# Patient Record
Sex: Female | Born: 1945
Health system: Southern US, Community
[De-identification: ages and names within clinical notes are randomized; demographics above are authoritative.]

## PROBLEM LIST (undated history)

## (undated) DIAGNOSIS — R112 Nausea with vomiting, unspecified: Secondary | ICD-10-CM

## (undated) DIAGNOSIS — Z87442 Personal history of urinary calculi: Secondary | ICD-10-CM

## (undated) DIAGNOSIS — M1990 Unspecified osteoarthritis, unspecified site: Secondary | ICD-10-CM

## (undated) DIAGNOSIS — Z9889 Other specified postprocedural states: Secondary | ICD-10-CM

## (undated) DIAGNOSIS — Z973 Presence of spectacles and contact lenses: Secondary | ICD-10-CM

## (undated) DIAGNOSIS — E7800 Pure hypercholesterolemia, unspecified: Secondary | ICD-10-CM

## (undated) HISTORY — PX: BRAIN SURGERY: SHX531

## (undated) HISTORY — PX: ABDOMINAL HYSTERECTOMY: SHX81

---

## 2000-02-11 ENCOUNTER — Other Ambulatory Visit: Admission: RE | Admit: 2000-02-11 | Discharge: 2000-02-11 | Payer: Self-pay | Admitting: Obstetrics and Gynecology

## 2000-10-01 ENCOUNTER — Ambulatory Visit (HOSPITAL_COMMUNITY)
Admission: RE | Admit: 2000-10-01 | Discharge: 2000-10-01 | Payer: Self-pay | Admitting: Physical Medicine & Rehabilitation

## 2000-10-01 ENCOUNTER — Encounter: Payer: Self-pay | Admitting: Physical Medicine & Rehabilitation

## 2001-02-22 ENCOUNTER — Other Ambulatory Visit: Admission: RE | Admit: 2001-02-22 | Discharge: 2001-02-22 | Payer: Self-pay | Admitting: Obstetrics and Gynecology

## 2007-04-17 ENCOUNTER — Emergency Department (HOSPITAL_COMMUNITY): Admission: EM | Admit: 2007-04-17 | Discharge: 2007-04-17 | Payer: Self-pay | Admitting: Emergency Medicine

## 2007-08-10 ENCOUNTER — Ambulatory Visit: Payer: Self-pay | Admitting: Cardiology

## 2007-12-03 ENCOUNTER — Ambulatory Visit (HOSPITAL_COMMUNITY)
Admission: RE | Admit: 2007-12-03 | Discharge: 2007-12-03 | Payer: Self-pay | Admitting: Physical Medicine & Rehabilitation

## 2008-01-18 ENCOUNTER — Emergency Department (HOSPITAL_COMMUNITY): Admission: EM | Admit: 2008-01-18 | Discharge: 2008-01-18 | Payer: Self-pay | Admitting: Family Medicine

## 2009-04-13 ENCOUNTER — Encounter: Payer: Self-pay | Admitting: Cardiology

## 2009-05-26 DIAGNOSIS — R634 Abnormal weight loss: Secondary | ICD-10-CM

## 2009-05-26 DIAGNOSIS — Z87891 Personal history of nicotine dependence: Secondary | ICD-10-CM | POA: Insufficient documentation

## 2009-05-31 ENCOUNTER — Ambulatory Visit: Payer: Self-pay | Admitting: Cardiology

## 2009-05-31 DIAGNOSIS — E782 Mixed hyperlipidemia: Secondary | ICD-10-CM

## 2009-07-27 ENCOUNTER — Ambulatory Visit: Payer: Self-pay | Admitting: Cardiology

## 2009-08-01 ENCOUNTER — Telehealth: Payer: Self-pay | Admitting: Cardiology

## 2009-08-01 LAB — CONVERTED CEMR LAB
ALT: 49 units/L — ABNORMAL HIGH (ref 0–35)
AST: 31 units/L (ref 0–37)
Albumin: 3.9 g/dL (ref 3.5–5.2)
Alkaline Phosphatase: 94 units/L (ref 39–117)
Bilirubin, Direct: 0 mg/dL (ref 0.0–0.3)
Cholesterol: 218 mg/dL — ABNORMAL HIGH (ref 0–200)
Direct LDL: 148.6 mg/dL
HDL: 56.6 mg/dL (ref >39.00)
Total Bilirubin: 0.7 mg/dL (ref 0.3–1.2)
Total CHOL/HDL Ratio: 4
Total Protein: 7.4 g/dL (ref 6.0–8.3)
Triglycerides: 126 mg/dL (ref 0.0–149.0)
VLDL: 25.2 mg/dL (ref 0.0–40.0)

## 2009-08-06 ENCOUNTER — Telehealth: Payer: Self-pay | Admitting: Cardiology

## 2009-09-13 ENCOUNTER — Ambulatory Visit: Payer: Self-pay | Admitting: Cardiology

## 2009-09-18 LAB — CONVERTED CEMR LAB
ALT: 31 units/L (ref 0–35)
AST: 26 units/L (ref 0–37)
Albumin: 3.9 g/dL (ref 3.5–5.2)
Alkaline Phosphatase: 73 units/L (ref 39–117)
Bilirubin, Direct: 0 mg/dL (ref 0.0–0.3)
Cholesterol: 149 mg/dL (ref 0–200)
HDL: 53.4 mg/dL (ref >39.00)
LDL Cholesterol: 73 mg/dL (ref 0–99)
Total Bilirubin: 0.8 mg/dL (ref 0.3–1.2)
Total CHOL/HDL Ratio: 3
Total Protein: 7 g/dL (ref 6.0–8.3)
Triglycerides: 111 mg/dL (ref 0.0–149.0)
VLDL: 22.2 mg/dL (ref 0.0–40.0)

## 2010-11-26 ENCOUNTER — Ambulatory Visit: Payer: BC Managed Care – PPO | Admitting: Physical Medicine & Rehabilitation

## 2010-11-26 ENCOUNTER — Encounter: Payer: BC Managed Care – PPO | Attending: Physical Medicine & Rehabilitation

## 2010-11-26 DIAGNOSIS — M719 Bursopathy, unspecified: Secondary | ICD-10-CM | POA: Insufficient documentation

## 2010-11-26 DIAGNOSIS — M1710 Unilateral primary osteoarthritis, unspecified knee: Secondary | ICD-10-CM

## 2010-11-26 DIAGNOSIS — M25569 Pain in unspecified knee: Secondary | ICD-10-CM | POA: Insufficient documentation

## 2010-11-26 DIAGNOSIS — M25519 Pain in unspecified shoulder: Secondary | ICD-10-CM | POA: Insufficient documentation

## 2010-11-26 DIAGNOSIS — M47812 Spondylosis without myelopathy or radiculopathy, cervical region: Secondary | ICD-10-CM

## 2010-11-26 DIAGNOSIS — G8929 Other chronic pain: Secondary | ICD-10-CM | POA: Insufficient documentation

## 2010-11-26 DIAGNOSIS — M542 Cervicalgia: Secondary | ICD-10-CM | POA: Insufficient documentation

## 2010-11-26 DIAGNOSIS — M67919 Unspecified disorder of synovium and tendon, unspecified shoulder: Secondary | ICD-10-CM | POA: Insufficient documentation

## 2010-11-26 DIAGNOSIS — M7530 Calcific tendinitis of unspecified shoulder: Secondary | ICD-10-CM

## 2011-02-04 NOTE — Assessment & Plan Note (Signed)
[facility]                            CARDIOLOGY OFFICE NOTE   NAME:Rachel Barker                     MRN:          [id number]  DATE:08/10/2007                            DOB:          [birth year]    CHIEF COMPLAINT:  My cholesterol is high, and I am getting fat.   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 Thressa Sheller.   Her cardiac risk factors include age, hyperlipidemia with a total  cholesterol of 255, HDL 48, LDL 179, triglycerides 139.  She has a  remote history of smoking but was never heavy.   She is not able to exercise because of long hours and just not having  the desire now that her husband has died.  He died of non-small-cell  lung cancer about a year and one-half ago.  She lives alone.   She said that when she weighed 20 pounds less, her HDL was 60.   She is having no symptoms of angina or ischemia.   PAST MEDICAL HISTORY:  She is not allergic to dye.  She is intolerant to  Athalia, VICODIN, and PERCOCET.   She drinks a couple of cups of coffee a day, but she is cutting that out  and going just to decaf Cokes.   She does a lot of yard work,  walks occasionally but not regularly.   PAST SURGICAL HISTORY:  1. Tonsillectomy.  2. Hysterectomy.  3. Oophorectomy.  4. Breast implants.   CURRENT MEDICATIONS:  1. Premarin cream 2 times a week.  2. Cymbalta 30 mg a day.   FAMILY HISTORY:  Negative for premature coronary disease.   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  She is an  Clinical research associate, by the way.  She is a widow and has one child.  The  child lives not too far from her.   REVIEW OF SYSTEMS:  Negative otherwise.   PHYSICAL EXAMINATION:  VITAL SIGNS:  Blood pressure 119/82, pulse 89 and  regular. She is 5 feet 3-1/2 inches.  She weight 148  pounds.  HEENT:  Normocephalic and atraumatic.  PERRLA.  Extraocular movements  intact.  Sclerae clear.  Facial symmetry is normal.  NECK:  Supple.  Carotid upstrokes are equal bilaterally without bruits.  No JVD.  Thyroid is not enlarged. Trachea is midline.  LUNGS:  Clear.  HEART:  Reveals a nondisplaced PMI.  She has normal S1 and S2.  ABDOMEN:  Soft.  Good bowel sounds.  There is no midline bruit.  There  is no hepatomegaly.  EXTREMITIES:  Reveal no cyanosis, clubbing, or edema.  Pulses are  intact.  NEUROLOGIC:  Exam is intact.  SKIN:  Unremarkable.   EKG is completely normal.   I have had about a 30-minute discussion with Rachel Barker.  We talked about  therapeutic lifestyle changes including trying to lose about 15 pounds  realistically in the next 6 months, to walk 3  hours per week and then  have repeat blood work including a lipid panel as well as CRP per the  JUPITER study.  She seems to buy in and have ownership into improving  her lifestyle.   PLAN:  1. Fifteen pounds of weight loss over the next 6 months. I will see      her back in May 2009.  2. Three hours of walking per week.  3. Repeat lipids and LFTs in May.  At that time, hopefully her HDL      will be in the mid 50s or around 60.  If her HDL is increased that      much, she will have a very favorable total cholesterol-HDL ratio      and probably will not warrant a statin.  We will also check a C-      reactive protein.     Thomas C. Daleen Squibb, MD, [facility]  Electronically Signed    TCW/MedQ  DD: 08/10/2007  DT: 08/11/2007  Job #: [id number]   cc:   Guy Sandifer. Henderson Cloud, M.D.

## 2013-04-07 ENCOUNTER — Other Ambulatory Visit: Payer: Self-pay | Admitting: Obstetrics and Gynecology

## 2013-04-07 DIAGNOSIS — R928 Other abnormal and inconclusive findings on diagnostic imaging of breast: Secondary | ICD-10-CM

## 2013-04-19 ENCOUNTER — Ambulatory Visit
Admission: RE | Admit: 2013-04-19 | Discharge: 2013-04-19 | Disposition: A | Discharge status: Home / Self Care | Payer: Medicare Other | Source: Ambulatory Visit | Attending: Obstetrics and Gynecology | Admitting: Obstetrics and Gynecology

## 2013-04-19 DIAGNOSIS — R928 Other abnormal and inconclusive findings on diagnostic imaging of breast: Secondary | ICD-10-CM

## 2015-10-12 DIAGNOSIS — H3562 Retinal hemorrhage, left eye: Secondary | ICD-10-CM | POA: Diagnosis not present

## 2015-10-12 DIAGNOSIS — H532 Diplopia: Secondary | ICD-10-CM | POA: Diagnosis not present

## 2015-10-12 DIAGNOSIS — H2513 Age-related nuclear cataract, bilateral: Secondary | ICD-10-CM | POA: Diagnosis not present

## 2015-10-19 ENCOUNTER — Emergency Department (HOSPITAL_COMMUNITY): Payer: Medicare Other

## 2015-10-19 ENCOUNTER — Encounter (HOSPITAL_COMMUNITY): Payer: Self-pay | Admitting: Cardiology

## 2015-10-19 ENCOUNTER — Emergency Department (HOSPITAL_COMMUNITY)
Admission: EM | Admit: 2015-10-19 | Discharge: 2015-10-19 | Disposition: A | Discharge status: Home / Self Care | Payer: Medicare Other | Attending: Emergency Medicine | Admitting: Emergency Medicine

## 2015-10-19 DIAGNOSIS — Z7982 Long term (current) use of aspirin: Secondary | ICD-10-CM | POA: Diagnosis not present

## 2015-10-19 DIAGNOSIS — H538 Other visual disturbances: Secondary | ICD-10-CM | POA: Diagnosis present

## 2015-10-19 DIAGNOSIS — Z8639 Personal history of other endocrine, nutritional and metabolic disease: Secondary | ICD-10-CM | POA: Diagnosis not present

## 2015-10-19 DIAGNOSIS — H4922 Sixth [abducent] nerve palsy, left eye: Secondary | ICD-10-CM | POA: Insufficient documentation

## 2015-10-19 DIAGNOSIS — G529 Cranial nerve disorder, unspecified: Secondary | ICD-10-CM | POA: Diagnosis not present

## 2015-10-19 DIAGNOSIS — Z87891 Personal history of nicotine dependence: Secondary | ICD-10-CM | POA: Insufficient documentation

## 2015-10-19 DIAGNOSIS — H532 Diplopia: Secondary | ICD-10-CM

## 2015-10-19 DIAGNOSIS — R51 Headache: Secondary | ICD-10-CM | POA: Diagnosis not present

## 2015-10-19 HISTORY — DX: Pure hypercholesterolemia, unspecified: E78.00

## 2015-10-19 LAB — COMPREHENSIVE METABOLIC PANEL
ALBUMIN: 4 g/dL (ref 3.5–5.0)
ALK PHOS: 88 U/L (ref 38–126)
ALT: 23 U/L (ref 14–54)
AST: 21 U/L (ref 15–41)
Anion gap: 10 (ref 5–15)
BILIRUBIN TOTAL: 0.5 mg/dL (ref 0.3–1.2)
BUN: 13 mg/dL (ref 6–20)
CO2: 26 mmol/L (ref 22–32)
CREATININE: 0.74 mg/dL (ref 0.44–1.00)
Calcium: 9.4 mg/dL (ref 8.9–10.3)
Chloride: 105 mmol/L (ref 101–111)
GFR calc Af Amer: >60 mL/min (ref >60)
GLUCOSE: 97 mg/dL (ref 65–99)
POTASSIUM: 3.8 mmol/L (ref 3.5–5.1)
Sodium: 141 mmol/L (ref 135–145)
TOTAL PROTEIN: 6.7 g/dL (ref 6.5–8.1)

## 2015-10-19 LAB — CBC WITH DIFFERENTIAL/PLATELET
BASOS PCT: 0 %
Basophils Absolute: 0 10*3/uL (ref 0.0–0.1)
Eosinophils Absolute: 0 10*3/uL (ref 0.0–0.7)
Eosinophils Relative: 0 %
HEMATOCRIT: 43.1 % (ref 36.0–46.0)
Hemoglobin: 14.5 g/dL (ref 12.0–15.0)
LYMPHS PCT: 23 %
Lymphs Abs: 1.8 10*3/uL (ref 0.7–4.0)
MCH: 30.9 pg (ref 26.0–34.0)
MCHC: 33.6 g/dL (ref 30.0–36.0)
MCV: 91.7 fL (ref 78.0–100.0)
MONO ABS: 0.5 10*3/uL (ref 0.1–1.0)
MONOS PCT: 6 %
NEUTROS ABS: 5.5 10*3/uL (ref 1.7–7.7)
Neutrophils Relative %: 71 %
Platelets: 328 10*3/uL (ref 150–400)
RBC: 4.7 MIL/uL (ref 3.87–5.11)
RDW: 12.6 % (ref 11.5–15.5)
WBC: 7.8 10*3/uL (ref 4.0–10.5)

## 2015-10-19 LAB — BASIC METABOLIC PANEL
ANION GAP: 13 (ref 5–15)
BUN: 14 mg/dL (ref 6–20)
CALCIUM: 9.7 mg/dL (ref 8.9–10.3)
CHLORIDE: 106 mmol/L (ref 101–111)
CO2: 24 mmol/L (ref 22–32)
Creatinine, Ser: 0.69 mg/dL (ref 0.44–1.00)
GFR calc non Af Amer: >60 mL/min (ref >60)
GLUCOSE: 97 mg/dL (ref 65–99)
POTASSIUM: 3.9 mmol/L (ref 3.5–5.1)
Sodium: 143 mmol/L (ref 135–145)

## 2015-10-19 LAB — TSH: TSH: 2.403 u[IU]/mL (ref 0.350–4.500)

## 2015-10-19 MED ORDER — GADOBENATE DIMEGLUMINE 529 MG/ML IV SOLN
14.0000 mL | Freq: Once | INTRAVENOUS | Status: AC | PRN
  Administered 2015-10-19: 14 mL via INTRAVENOUS

## 2015-10-19 NOTE — Discharge Instructions (Signed)
You'll need an MRI of your pituitary to evaluate for the possibility of a left cavernous sinus meningioma  Please return to the emergency department for any new or worsening symptoms

## 2015-10-19 NOTE — ED Notes (Signed)
Reports she has been having double vision in the left eye since Thursday. Went to the eye doctor on Friday and had labs done with normal exam. States she was told she may need an MRI. Also reports a mild headache and eye pain towards the end of the day.

## 2015-10-19 NOTE — ED Provider Notes (Signed)
Patient noted to have a possible left-sided cavernous sinus meningioma.  This would likely explain her symptoms.  It is inconclusive however on MRI today and she will need MRI of her pituitary which can be completed as an outpatient.  This is unable to be completed today secondary to the use of contrast with the prior MRI.  I spoke with neurosurgery who recommends outpatient follow-up.  I have updated the patient and she understands that she'll need additional imaging.  She is without a primary care doctor at this time and therefore she will follow-up with the neurosurgical team here in town.  Jola Schmidt, MD 10/19/15 2108

## 2015-10-19 NOTE — ED Notes (Signed)
Patient verbalized understanding of discharge instructions and denies any further needs or questions at this time. VS stable. Patient ambulatory with steady gait.  

## 2015-10-19 NOTE — ED Provider Notes (Signed)
CSN: [id number]     Arrival date & time 10/19/15  1111 History   First MD Initiated Contact with Patient 10/19/15 1309     Chief Complaint  Patient presents with  . Eye Pain  . Blurred Vision     (Consider location/radiation/quality/duration/timing/severity/associated sxs/prior Treatment) HPI   Pt is a 70 yo female with PMH of hypercholesteremia who presents to the ED with blurred vision, onset 1 week. Pt reports having constant diplopia when she has both eyes open. She notes she saw her ophthalmologist (Dr. Katy Fitch) last Friday, negative blood work and ESR and was advised that she may need an MRI for further evaluation. She notes she had a "tension" headache last night located to her frontal and occipital region, denies any aggravating or alleviating factors. She notes she took one dose of tylenol before going to bed and notes when she woke up this morning and her headache was resolved. However she reports waking up this morning and having ptosis of her left eye which resolved over 1-2 hours. Denies fever, chills, neck pain/stiffness, lightheadedness, dizziness, cough, SOB, CP, palpitations, abdominal pain, N/V/D, numbness, tingling, weakness.  Past Medical History  Diagnosis Date  . Hypercholesteremia    Past Surgical History  Procedure Laterality Date  . Abdominal hysterectomy    . Brain surgery     Family History  Problem Relation Age of Onset  . Hyperlipidemia Mother   . Hyperlipidemia Father    Social History  Substance Use Topics  . Smoking status: Former Research scientist (life sciences)  . Smokeless tobacco: None  . Alcohol Use: Yes   OB History    No data available     Review of Systems  Eyes: Positive for visual disturbance. Photophobia: double vision.       Left eye ptosis  Neurological: Positive for headaches.      Allergies  Fentanyl; Hydrocodone-acetaminophen; and Oxycodone-acetaminophen  Home Medications   Prior to Admission medications   Medication Sig Start Date End Date  Taking? Authorizing Provider  aspirin 81 MG tablet Take 81 mg by mouth daily.   Yes Historical Provider, MD   BP 125/71 mmHg  Pulse 66  Temp(Src) 98.5 F (36.9 C) (Oral)  Resp 19  Ht 5' 3.5" (1.613 m)  Wt 66.679 kg  BMI 25.63 kg/m2  SpO2 94% Physical Exam  Constitutional: She is oriented to person, place, and time. She appears well-developed and well-nourished.  HENT:  Head: Normocephalic and atraumatic.  Mouth/Throat: Oropharynx is clear and moist. No oropharyngeal exudate.  Eyes: Conjunctivae and EOM are normal. Pupils are equal, round, and reactive to light. Right eye exhibits no discharge. Left eye exhibits no discharge. No scleral icterus.  Decreased ROM of left eye with lateral gaze, otherwise bilateral eyes EOMI  Neck: Normal range of motion. Neck supple.  Cardiovascular: Normal rate, regular rhythm, normal heart sounds and intact distal pulses.   Pulmonary/Chest: Effort normal and breath sounds normal. No respiratory distress. She has no wheezes. She has no rales. She exhibits no tenderness.  Abdominal: Soft. Bowel sounds are normal. She exhibits no distension and no mass. There is no tenderness. There is no rebound and no guarding.  Musculoskeletal: Normal range of motion. She exhibits no edema or tenderness.  Lymphadenopathy:    She has no cervical adenopathy.  Neurological: She is alert and oriented to person, place, and time. She has normal strength. No cranial nerve deficit or sensory deficit. She displays a negative Romberg sign. Coordination normal.  Skin: Skin is warm and dry.  Nursing note and vitals reviewed.   ED Course  Procedures (including critical care time) Labs Review Labs Reviewed  CBC WITH DIFFERENTIAL/PLATELET  BASIC METABOLIC PANEL  COMPREHENSIVE METABOLIC PANEL  HEMOGLOBIN A1C  TSH    Imaging Review No results found. I have personally reviewed and evaluated these images and lab results as part of my medical decision-making.   EKG  Interpretation None      MDM   Final diagnoses:  Sixth nerve palsy, left  Sixth nerve palsy, left    Pt presents with diplopia an transient left ptosis. She was initially evaluated by her ophthalmologist 1 week ago when sxs started Pt reports her exam and labs negative, and Dr. Katy Fitch recommended MRI for further evaluation. VSS. Exam revealed left CN-6 palsy.   Consulted neurology. Dr. Silverio Decamp reports he will come evaluate the pt in the ED. Neuro evaluated pt in the ED and recommended to order MRI brain w/ and w/o contrast to r/o posterior fossa or brainstem pathology and MRA head to r/o intracranial aneurysm. Recommended if MRIs are negative to have pt f/u with ophthalmologist outpatient in 1-2 weeks.   Hand-off to Dr. Venora Maples. MRIs pending.   Chesley Noon [place], [place] 10/19/15 1944  Merrily Pew, MD 10/20/15 [phone]

## 2015-10-19 NOTE — Consult Note (Signed)
Requesting Physician: Dr. Dayna Barker    Chief Complaint:  Left 6th nerve palsy  HPI:                                                                                                                                         Rachel Barker is an 70 y.o. female who noted about one week ago she had transient diplopia at far distances. This came and went but due to no pain she did not seek attention. Due to the diplopia not going away and some eye pain while reading she saw a ophthalmologist.  Her eye exam was normal other than a 6th nerve palsy. She was instructed to go to ED.  She denies any neck pain or trauma. She did have transient ptosis of left eye this am but this has resolved. No pain other than when she looks to the right.  This causes pain in the left eye. She has been on ASA for 7 days.   Date last known well: one weeks ago Time last known well: Unable to determine tPA Given: No: out of window     Past Medical History  Diagnosis Date  . Hypercholesteremia     Past Surgical History  Procedure Laterality Date  . Abdominal hysterectomy    . Brain surgery      Family History  Problem Relation Age of Onset  . Hyperlipidemia Mother   . Hyperlipidemia Father    Social History:  reports that she has quit smoking. She does not have any smokeless tobacco history on file. She reports that she drinks alcohol. She reports that she does not use illicit drugs.  Allergies:  Allergies  Allergen Reactions  . Fentanyl Swelling  . Hydrocodone-Acetaminophen Swelling  . Oxycodone-Acetaminophen Itching and Swelling    Medications:                                                                                                                           No current facility-administered medications for this encounter.   Current Outpatient Prescriptions  Medication Sig Dispense Refill  . aspirin 81 MG tablet Take 81 mg by mouth daily.       ROS:  History obtained from the patient  General ROS: negative for - chills, fatigue, fever, night sweats, weight gain or weight loss Psychological ROS: negative for - behavioral disorder, hallucinations, memory difficulties, mood swings or suicidal ideation Ophthalmic ROS: negative for - blurry vision, double vision, eye pain or loss of vision ENT ROS: negative for - epistaxis, nasal discharge, oral lesions, sore throat, tinnitus or vertigo Allergy and Immunology ROS: negative for - hives or itchy/watery eyes Hematological and Lymphatic ROS: negative for - bleeding problems, bruising or swollen lymph nodes Endocrine ROS: negative for - galactorrhea, hair pattern changes, polydipsia/polyuria or temperature intolerance Respiratory ROS: negative for - cough, hemoptysis, shortness of breath or wheezing Cardiovascular ROS: negative for - chest pain, dyspnea on exertion, edema or irregular heartbeat Gastrointestinal ROS: negative for - abdominal pain, diarrhea, hematemesis, nausea/vomiting or stool incontinence Genito-Urinary ROS: negative for - dysuria, hematuria, incontinence or urinary frequency/urgency Musculoskeletal ROS: negative for - joint swelling or muscular weakness Neurological ROS: as noted in HPI Dermatological ROS: negative for rash and skin lesion changes  Neurologic Examination:                                                                                                      Blood pressure 134/82, pulse 66, temperature 98.5 F (36.9 C), temperature source Oral, resp. rate 15, height 5' 3.5" (1.613 m), weight 66.679 kg (147 lb), SpO2 98 %.  HEENT-  Normocephalic, no lesions, without obvious abnormality.  Normal external eye and conjunctiva.  Normal TM's bilaterally.  Normal auditory canals and external ears. Normal external nose, mucus membranes and septum.  Normal pharynx. Cardiovascular- S1, S2  normal, pulses palpable throughout   Lungs- chest clear, no wheezing, rales, normal symmetric air entry Abdomen- normal findings: bowel sounds normal Extremities- no edema Lymph-no adenopathy palpable Musculoskeletal-no joint tenderness, deformity or swelling Skin-warm and dry, no hyperpigmentation, vitiligo, or suspicious lesions  Neurological Examination Mental Status: Alert, oriented, thought content appropriate.  Speech fluent without evidence of aphasia.  Able to follow 3 step commands without difficulty. Cranial Nerves: II: Discs flat bilaterally; Visual fields grossly normal, pupils equal, round, reactive to light and accommodation III,IV, VI: ptosis not present, extra-ocular motions intact right eye but left eye has a sixth nerve palsy V,VII: smile symmetric, facial light touch sensation normal bilaterally VIII: hearing normal bilaterally IX,X: uvula rises symmetrically XI: bilateral shoulder shrug XII: midline tongue extension Motor: Right : Upper extremity   5/5    Left:     Upper extremity   5/5  Lower extremity   5/5     Lower extremity   5/5 Tone and bulk:normal tone throughout; no atrophy noted Sensory: Pinprick and light touch intact throughout, bilaterally Deep Tendon Reflexes: 2+ and symmetric throughout Plantars: Right: downgoing   Left: downgoing Cerebellar: normal finger-to-nose and normal heel-to-shin test Gait: not tested       Lab Results: Basic Metabolic Panel: No results for input(s): NA, K, CL, CO2, GLUCOSE, BUN, CREATININE, CALCIUM, MG, PHOS in the last 168 hours.  Liver Function Tests: No results for input(s): AST, ALT, ALKPHOS, BILITOT,  PROT, ALBUMIN in the last 168 hours. No results for input(s): LIPASE, AMYLASE in the last 168 hours. No results for input(s): AMMONIA in the last 168 hours.  CBC:  Recent Labs Lab 10/19/15 1438  WBC 7.8  NEUTROABS 5.5  HGB 14.5  HCT 43.1  MCV 91.7  PLT 328    Cardiac Enzymes: No results for  input(s): CKTOTAL, CKMB, CKMBINDEX, TROPONINI in the last 168 hours.  Lipid Panel: No results for input(s): CHOL, TRIG, HDL, CHOLHDL, VLDL, LDLCALC in the last 168 hours.  CBG: No results for input(s): GLUCAP in the last 168 hours.  Microbiology: No results found for this or any previous visit.  Coagulation Studies: No results for input(s): LABPROT, INR in the last 72 hours.  Imaging: No results found.     Assessment and plan discussed with with attending physician and they are in agreement.    Etta Quill PA-C [facility] [phone]  10/19/2015, 2:59 PM   Assessment: 70 y.o. female with 1 week of transient diplopia and today went to ophthalmologist who noted a 6th nerve palsy. She has no signs of Horner's syndrome and has only isolated 6th nerve pasly. At this time must evaluate for brain stem infarct, aneurysm, diabetes and carotid dissection.   Stroke Risk Factors - hyperlipidemia  Recommend: 1) MRI/ MRA head 2) MRA neck 3) A1c

## 2015-10-22 LAB — HEMOGLOBIN A1C
Hgb A1c MFr Bld: 6 % — ABNORMAL HIGH (ref 4.8–5.6)
Mean Plasma Glucose: 126 mg/dL

## 2015-10-31 ENCOUNTER — Other Ambulatory Visit (HOSPITAL_COMMUNITY): Payer: Self-pay | Admitting: Neurosurgery

## 2015-10-31 DIAGNOSIS — D4989 Neoplasm of unspecified behavior of other specified sites: Secondary | ICD-10-CM

## 2015-11-08 ENCOUNTER — Ambulatory Visit (HOSPITAL_COMMUNITY)
Admission: RE | Admit: 2015-11-08 | Discharge: 2015-11-08 | Disposition: A | Discharge status: Home / Self Care | Payer: Medicare Other | Source: Ambulatory Visit | Attending: Neurosurgery | Admitting: Neurosurgery

## 2015-11-08 DIAGNOSIS — D4989 Neoplasm of unspecified behavior of other specified sites: Secondary | ICD-10-CM

## 2015-11-08 DIAGNOSIS — H532 Diplopia: Secondary | ICD-10-CM | POA: Insufficient documentation

## 2015-11-08 MED ORDER — GADOBENATE DIMEGLUMINE 529 MG/ML IV SOLN
15.0000 mL | Freq: Once | INTRAVENOUS | Status: AC | PRN
  Administered 2015-11-08: 13 mL via INTRAVENOUS

## 2015-12-25 DIAGNOSIS — Z1389 Encounter for screening for other disorder: Secondary | ICD-10-CM | POA: Diagnosis not present

## 2015-12-25 DIAGNOSIS — Z0000 Encounter for general adult medical examination without abnormal findings: Secondary | ICD-10-CM | POA: Diagnosis not present

## 2015-12-25 DIAGNOSIS — E785 Hyperlipidemia, unspecified: Secondary | ICD-10-CM | POA: Diagnosis not present

## 2015-12-25 DIAGNOSIS — H532 Diplopia: Secondary | ICD-10-CM | POA: Diagnosis not present

## 2015-12-25 DIAGNOSIS — Z6825 Body mass index (BMI) 25.0-25.9, adult: Secondary | ICD-10-CM | POA: Diagnosis not present

## 2015-12-25 DIAGNOSIS — E559 Vitamin D deficiency, unspecified: Secondary | ICD-10-CM | POA: Diagnosis not present

## 2015-12-26 DIAGNOSIS — D7589 Other specified diseases of blood and blood-forming organs: Secondary | ICD-10-CM | POA: Diagnosis not present

## 2015-12-26 DIAGNOSIS — Z0000 Encounter for general adult medical examination without abnormal findings: Secondary | ICD-10-CM | POA: Diagnosis not present

## 2016-05-29 DIAGNOSIS — Z780 Asymptomatic menopausal state: Secondary | ICD-10-CM | POA: Diagnosis not present

## 2016-05-29 DIAGNOSIS — E559 Vitamin D deficiency, unspecified: Secondary | ICD-10-CM | POA: Diagnosis not present

## 2016-06-24 DIAGNOSIS — Z6825 Body mass index (BMI) 25.0-25.9, adult: Secondary | ICD-10-CM | POA: Diagnosis not present

## 2016-06-24 DIAGNOSIS — E785 Hyperlipidemia, unspecified: Secondary | ICD-10-CM | POA: Diagnosis not present

## 2016-06-24 DIAGNOSIS — E784 Other hyperlipidemia: Secondary | ICD-10-CM | POA: Diagnosis not present

## 2016-06-24 DIAGNOSIS — M859 Disorder of bone density and structure, unspecified: Secondary | ICD-10-CM | POA: Diagnosis not present

## 2016-11-05 DIAGNOSIS — S46812A Strain of other muscles, fascia and tendons at shoulder and upper arm level, left arm, initial encounter: Secondary | ICD-10-CM | POA: Diagnosis not present

## 2016-11-13 DIAGNOSIS — S46812A Strain of other muscles, fascia and tendons at shoulder and upper arm level, left arm, initial encounter: Secondary | ICD-10-CM | POA: Diagnosis not present

## 2016-11-28 DIAGNOSIS — M7502 Adhesive capsulitis of left shoulder: Secondary | ICD-10-CM | POA: Diagnosis not present

## 2016-11-28 DIAGNOSIS — S46812D Strain of other muscles, fascia and tendons at shoulder and upper arm level, left arm, subsequent encounter: Secondary | ICD-10-CM | POA: Diagnosis not present

## 2016-12-11 DIAGNOSIS — M7502 Adhesive capsulitis of left shoulder: Secondary | ICD-10-CM | POA: Diagnosis not present

## 2016-12-16 DIAGNOSIS — M7502 Adhesive capsulitis of left shoulder: Secondary | ICD-10-CM | POA: Diagnosis not present

## 2016-12-18 DIAGNOSIS — M7502 Adhesive capsulitis of left shoulder: Secondary | ICD-10-CM | POA: Diagnosis not present

## 2016-12-23 DIAGNOSIS — M7502 Adhesive capsulitis of left shoulder: Secondary | ICD-10-CM | POA: Diagnosis not present

## 2016-12-26 DIAGNOSIS — M7502 Adhesive capsulitis of left shoulder: Secondary | ICD-10-CM | POA: Diagnosis not present

## 2016-12-30 DIAGNOSIS — M7502 Adhesive capsulitis of left shoulder: Secondary | ICD-10-CM | POA: Diagnosis not present

## 2017-01-01 DIAGNOSIS — M7502 Adhesive capsulitis of left shoulder: Secondary | ICD-10-CM | POA: Diagnosis not present

## 2017-01-02 DIAGNOSIS — D7589 Other specified diseases of blood and blood-forming organs: Secondary | ICD-10-CM | POA: Diagnosis not present

## 2017-01-02 DIAGNOSIS — E784 Other hyperlipidemia: Secondary | ICD-10-CM | POA: Diagnosis not present

## 2017-01-02 DIAGNOSIS — E559 Vitamin D deficiency, unspecified: Secondary | ICD-10-CM | POA: Diagnosis not present

## 2017-01-06 DIAGNOSIS — M7502 Adhesive capsulitis of left shoulder: Secondary | ICD-10-CM | POA: Diagnosis not present

## 2017-01-08 DIAGNOSIS — Z1212 Encounter for screening for malignant neoplasm of rectum: Secondary | ICD-10-CM | POA: Diagnosis not present

## 2017-01-08 DIAGNOSIS — M7502 Adhesive capsulitis of left shoulder: Secondary | ICD-10-CM | POA: Diagnosis not present

## 2017-01-09 DIAGNOSIS — M859 Disorder of bone density and structure, unspecified: Secondary | ICD-10-CM | POA: Diagnosis not present

## 2017-01-09 DIAGNOSIS — Z6826 Body mass index (BMI) 26.0-26.9, adult: Secondary | ICD-10-CM | POA: Diagnosis not present

## 2017-01-09 DIAGNOSIS — Z1389 Encounter for screening for other disorder: Secondary | ICD-10-CM | POA: Diagnosis not present

## 2017-01-09 DIAGNOSIS — E784 Other hyperlipidemia: Secondary | ICD-10-CM | POA: Diagnosis not present

## 2017-01-09 DIAGNOSIS — H532 Diplopia: Secondary | ICD-10-CM | POA: Diagnosis not present

## 2017-01-09 DIAGNOSIS — E559 Vitamin D deficiency, unspecified: Secondary | ICD-10-CM | POA: Diagnosis not present

## 2017-01-09 DIAGNOSIS — D7589 Other specified diseases of blood and blood-forming organs: Secondary | ICD-10-CM | POA: Diagnosis not present

## 2017-01-09 DIAGNOSIS — R600 Localized edema: Secondary | ICD-10-CM | POA: Diagnosis not present

## 2017-01-09 DIAGNOSIS — Z0000 Encounter for general adult medical examination without abnormal findings: Secondary | ICD-10-CM | POA: Diagnosis not present

## 2017-01-12 DIAGNOSIS — S46812D Strain of other muscles, fascia and tendons at shoulder and upper arm level, left arm, subsequent encounter: Secondary | ICD-10-CM | POA: Diagnosis not present

## 2017-01-12 DIAGNOSIS — M7502 Adhesive capsulitis of left shoulder: Secondary | ICD-10-CM | POA: Diagnosis not present

## 2017-01-15 DIAGNOSIS — M7502 Adhesive capsulitis of left shoulder: Secondary | ICD-10-CM | POA: Diagnosis not present

## 2017-01-19 DIAGNOSIS — M7502 Adhesive capsulitis of left shoulder: Secondary | ICD-10-CM | POA: Diagnosis not present

## 2017-01-21 DIAGNOSIS — Z1211 Encounter for screening for malignant neoplasm of colon: Secondary | ICD-10-CM | POA: Diagnosis not present

## 2017-01-21 DIAGNOSIS — Z1212 Encounter for screening for malignant neoplasm of rectum: Secondary | ICD-10-CM | POA: Diagnosis not present

## 2017-01-22 DIAGNOSIS — M7502 Adhesive capsulitis of left shoulder: Secondary | ICD-10-CM | POA: Diagnosis not present

## 2017-01-27 DIAGNOSIS — M7502 Adhesive capsulitis of left shoulder: Secondary | ICD-10-CM | POA: Diagnosis not present

## 2017-01-29 DIAGNOSIS — M7502 Adhesive capsulitis of left shoulder: Secondary | ICD-10-CM | POA: Diagnosis not present

## 2017-02-03 DIAGNOSIS — M7502 Adhesive capsulitis of left shoulder: Secondary | ICD-10-CM | POA: Diagnosis not present

## 2017-02-05 DIAGNOSIS — M7502 Adhesive capsulitis of left shoulder: Secondary | ICD-10-CM | POA: Diagnosis not present

## 2017-06-22 IMAGING — MR MR HEAD WO/W CM
13 of 18 series · 26 of 48 positions shown · IV contrast (multihance)
Comparison: Head CT 04/17/2007

CLINICAL DATA: One week history of left lateral rectus palsy.
Binocular diplopia. Severe headache beginning this morning.

EXAM:
MRI HEAD WITHOUT AND WITH CONTRAST
MRA HEAD WITHOUT CONTRAST
MRA NECK WITHOUT AND WITH CONTRAST
TECHNIQUE: Multiplanar, multiecho pulse sequences of the brain and surrounding
structures were obtained without and with intravenous contrast.
Angiographic images of the Circle of Willis were obtained using MRA
technique without intravenous contrast. Angiographic images of the
neck were obtained using MRA technique without and with intravenous
contrast. Carotid stenosis measurements (when applicable) are
obtained utilizing NASCET criteria, using the distal internal
carotid diameter as the denominator.
CONTRAST:  14mL MULTIHANCE GADOBENATE DIMEGLUMINE 529 MG/ML IV SOLN

[Series 3: T1 · sagittal · 5.0mm · 0.47mm/px · 1 of 24 slices shown]
[im 1/24]
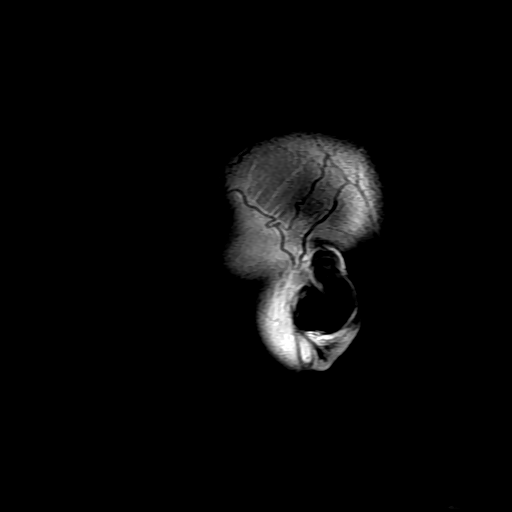

[Series 4: DWI · axial · 3.0mm · 1.09mm/px · z∈[-39,+105]mm · 4 of 98 slices shown (1 of 4)]
[im 1/98]
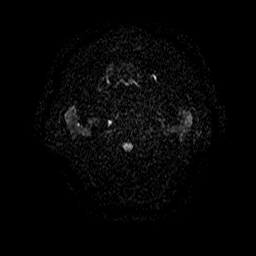
[im 33/98]
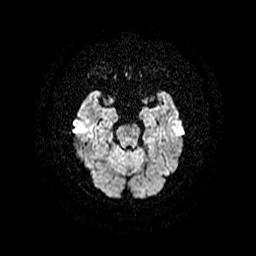
[im 65/98]
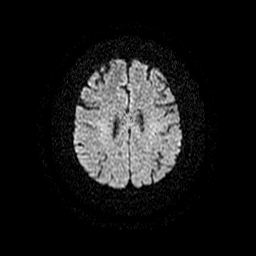
[im 98/98]
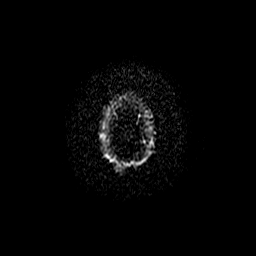

[Series 5: (id) mt fs · axial · 1.4mm · 0.43mm/px · z∈[-38,+69]mm · 7 of 154 slices shown]
[im 1/154]
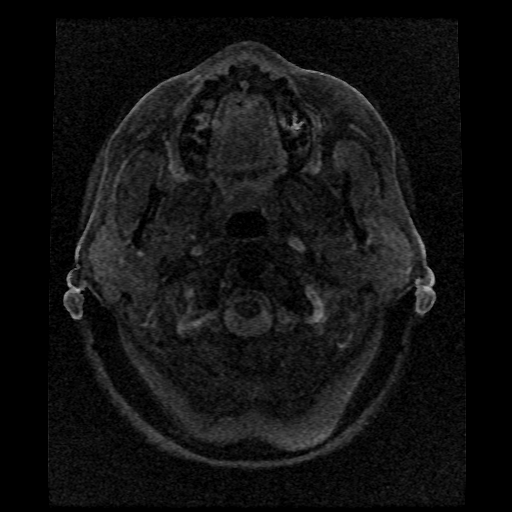
[im 26/154]
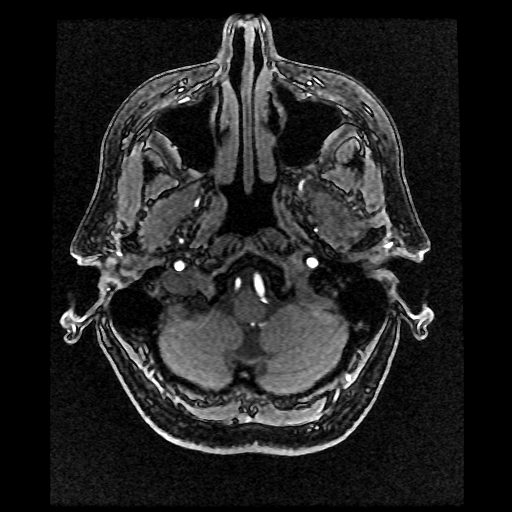
[im 52/154]
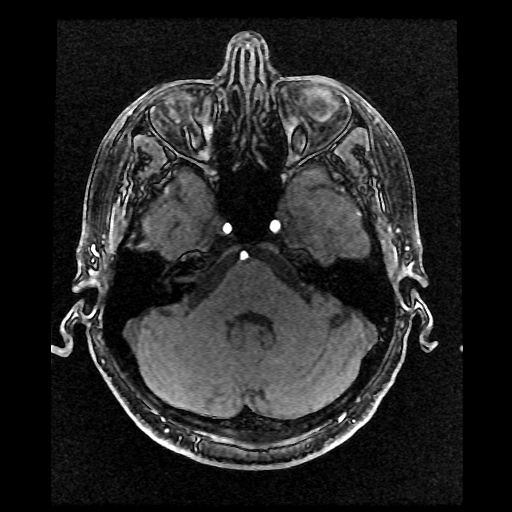
[im 77/154]
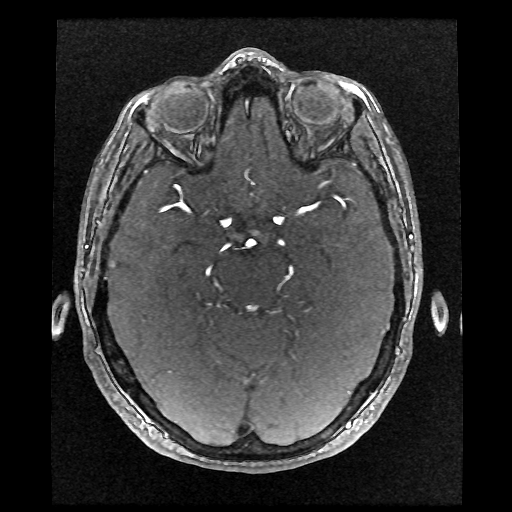
[im 103/154]
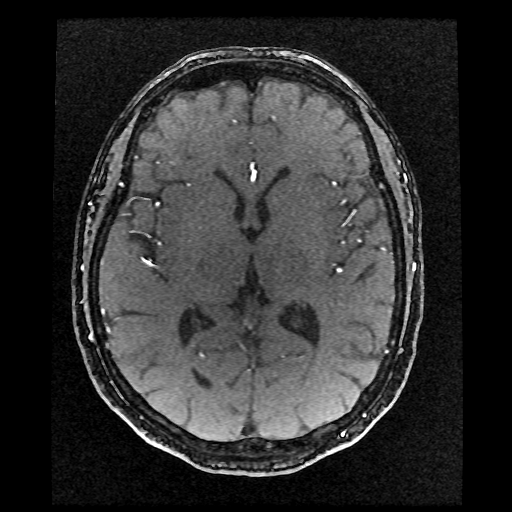
[im 128/154]
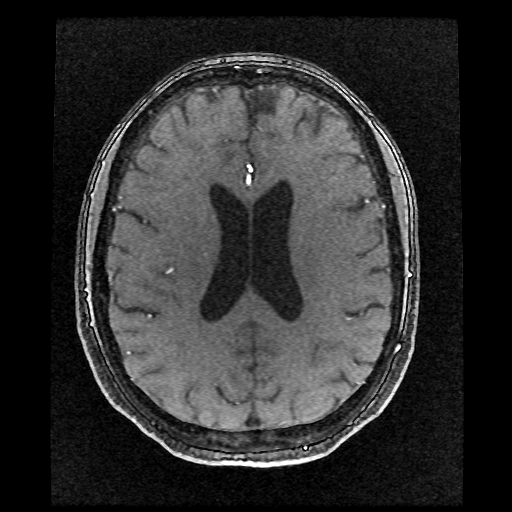
[im 154/154]
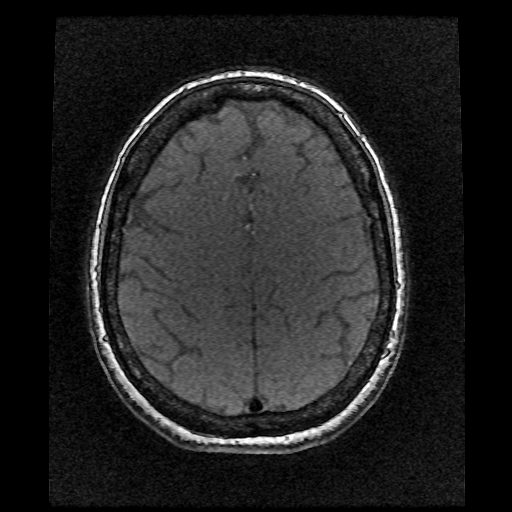

[Series 6: T2 · axial · 5.0mm · 0.43mm/px · 1 of 25 slices shown]
[im 1/25]
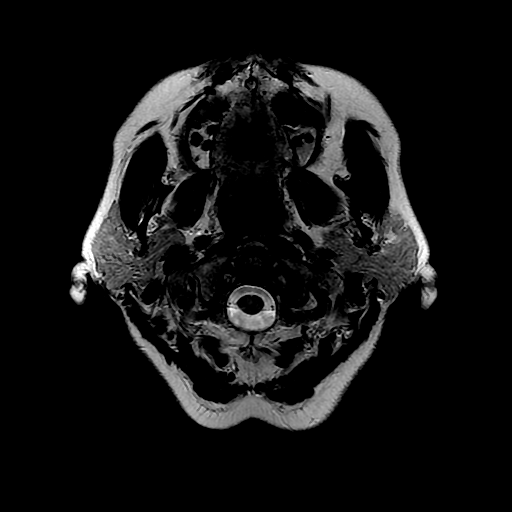

[Series 7: DWI · coronal · 5.0mm · 1.09mm/px · 3 of 70 slices shown (2 of 4)]
[im 1/70]
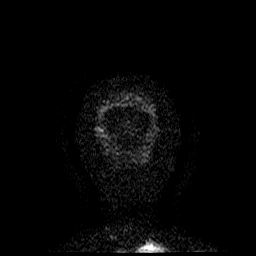
[im 35/70]
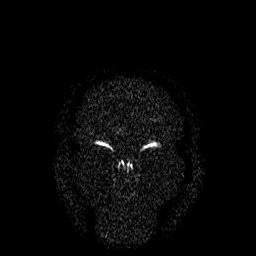
[im 70/70]
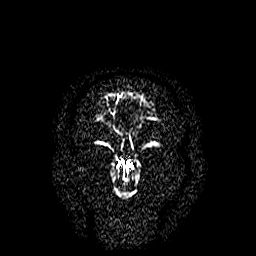

[Series 8: FLAIR · axial · 5.0mm · 0.43mm/px · 1 of 25 slices shown]
[im 1/25]
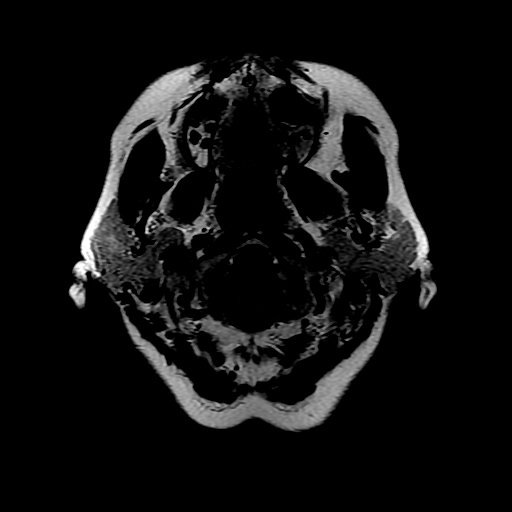

[Series 9: ax mpgr · axial · 5.0mm · 0.43mm/px · 1 of 25 slices shown]
[im 1/25]
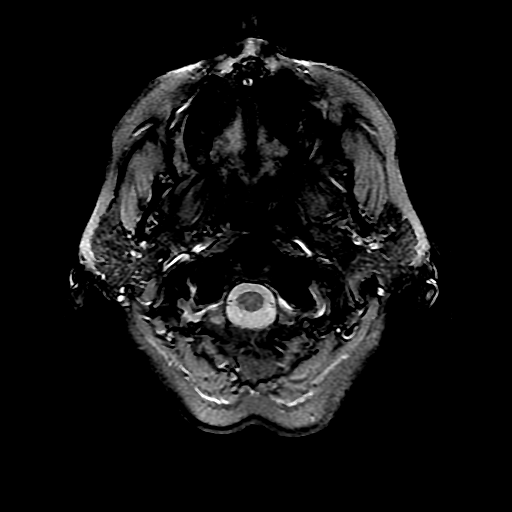

[Series 13: ax (id) · axial · 2.8mm · 0.47mm/px · 1 of 108 slices shown]
[im 1/108]
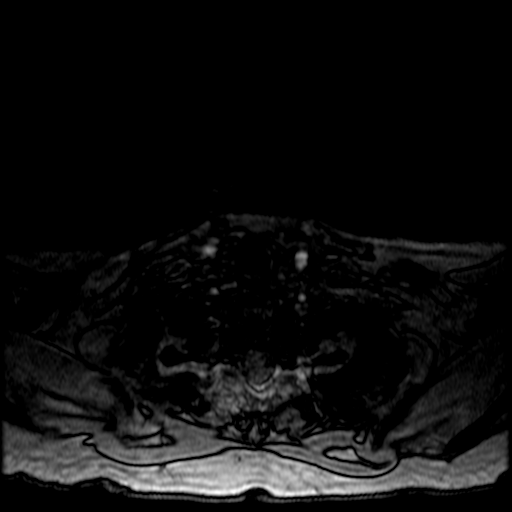

[Series 15: T2 post-contrast · coronal · 5.0mm · 0.78mm/px · 1 of 27 slices shown]
[im 1/27]
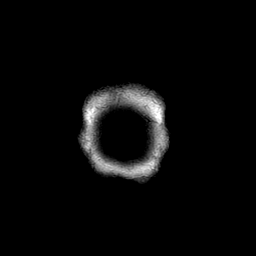

[Series 17: T1 post-contrast · coronal · 5.0mm · 0.78mm/px · 1 of 27 slices shown (1 of 2)]
[im 1/27]
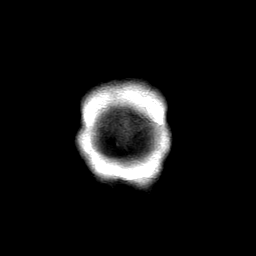

[Series 18: T1 post-contrast · sagittal · 5.0mm · 0.47mm/px · 1 of 24 slices shown (2 of 2)]
[im 1/24]
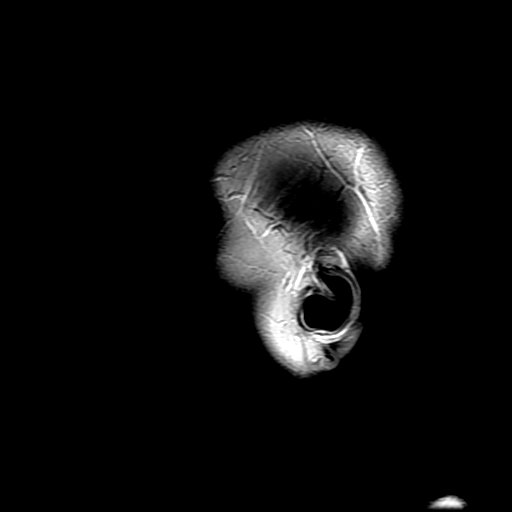

[Series 400: DWI · axial · 3.0mm · 1.09mm/px · z∈[-39,+105]mm · 2 of 49 slices shown (3 of 4)]
[im 1/49]
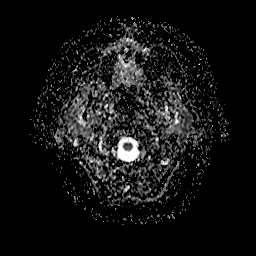
[im 49/49]
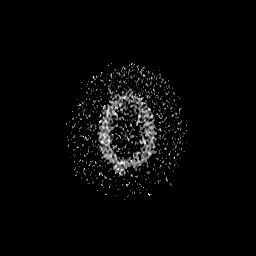

[Series 700: DWI · coronal · 5.0mm · 1.09mm/px · 2 of 35 slices shown (4 of 4)]
[im 1/35]
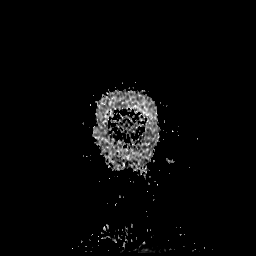
[im 35/35]
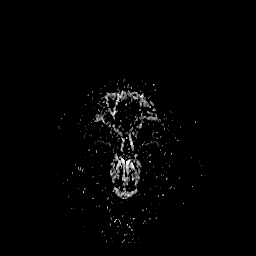

[26 of 48 positions shown; findings below may reference images not displayed]

FINDINGS: MRI HEAD FINDINGS

Diffusion imaging does not show any acute or subacute infarction.
The brainstem is normal. The cerebellum is normal. The cerebral
hemispheres show a few scattered punctate foci of T2 and FLAIR
signal in the white matter consistent with small vessel change,
fairly typical for age. No cortical or large vessel territory
infarction. No evidence of mass lesion, hemorrhage, hydrocephalus or
extra-axial collection. Cavernous sinus regions suggest the
possibility of some asymmetry on the left with a question of the a
fiber 6 mm region of enhancement that could possibly represent a
meningioma. If real, this could affect the left sixth nerve. No
abnormality seen at the orbital apices or affecting the orbits
themselves. No abnormal enhancement anywhere else in the region.

MRA HEAD FINDINGS

Both internal carotid arteries are widely patent into the brain. The
anterior and middle cerebral vessels are normal without proximal
stenosis, aneurysm or vascular malformation. Both vertebral arteries
are widely patent with the left being dominant. The basilar artery
is normal. Posterior circulation branch vessels are normal.

MRA NECK FINDINGS

The study Sever's from poor timing in some motion degradation.
Branching pattern of brachiocephalic vessels from the arch is
normal. No proximal vessel abnormality is suspected, though detail
is limited. Above the thoracic apex, both common carotid arteries
are widely patent to the bifurcation. Both carotid bifurcations are
normal. Both cervical internal carotid arteries are normal.
Vertebral artery origins are not well seen. Beyond the origins, the
vessels are symmetric in widely patent and normal through the
cervical region.
IMPRESSION: No acute brain finding. Minimal small vessel change of the white
matter, fairly typical for age.

Slight asymmetry of the cavernous sinus on the left. This raises the
possibility of a 5 or 6 mm meningioma. I cannot make that diagnosis
with certainty on this standard brain MRI however. If present, this
could be associated with left sixth nerve palsy. I would recommend a
dedicated pituitary MRI be performed as an outpatient. We have ruled
out any emergent causes of lateral rectus palsy, and we cannot do a
detailed exam at this time since contrast has been administered
today.

Normal intracranial MR angiography.

Probably normal neck MR angiography. Certainly, the carotid
bifurcations and internal carotid arteries are normal. Detail of the
vessels in the chest is limited.

## 2018-02-03 DIAGNOSIS — R82998 Other abnormal findings in urine: Secondary | ICD-10-CM | POA: Diagnosis not present

## 2018-02-03 DIAGNOSIS — E559 Vitamin D deficiency, unspecified: Secondary | ICD-10-CM | POA: Diagnosis not present

## 2018-02-03 DIAGNOSIS — E7849 Other hyperlipidemia: Secondary | ICD-10-CM | POA: Diagnosis not present

## 2018-02-10 DIAGNOSIS — D7589 Other specified diseases of blood and blood-forming organs: Secondary | ICD-10-CM | POA: Diagnosis not present

## 2018-02-10 DIAGNOSIS — H532 Diplopia: Secondary | ICD-10-CM | POA: Diagnosis not present

## 2018-02-10 DIAGNOSIS — E559 Vitamin D deficiency, unspecified: Secondary | ICD-10-CM | POA: Diagnosis not present

## 2018-02-10 DIAGNOSIS — M859 Disorder of bone density and structure, unspecified: Secondary | ICD-10-CM | POA: Diagnosis not present

## 2018-02-10 DIAGNOSIS — E7849 Other hyperlipidemia: Secondary | ICD-10-CM | POA: Diagnosis not present

## 2018-02-10 DIAGNOSIS — Z23 Encounter for immunization: Secondary | ICD-10-CM | POA: Diagnosis not present

## 2018-02-10 DIAGNOSIS — Z6827 Body mass index (BMI) 27.0-27.9, adult: Secondary | ICD-10-CM | POA: Diagnosis not present

## 2018-02-10 DIAGNOSIS — Z0000 Encounter for general adult medical examination without abnormal findings: Secondary | ICD-10-CM | POA: Diagnosis not present

## 2018-02-10 DIAGNOSIS — R600 Localized edema: Secondary | ICD-10-CM | POA: Diagnosis not present

## 2018-02-10 DIAGNOSIS — Z1389 Encounter for screening for other disorder: Secondary | ICD-10-CM | POA: Diagnosis not present

## 2018-02-10 DIAGNOSIS — M7502 Adhesive capsulitis of left shoulder: Secondary | ICD-10-CM | POA: Diagnosis not present

## 2018-02-12 DIAGNOSIS — Z1212 Encounter for screening for malignant neoplasm of rectum: Secondary | ICD-10-CM | POA: Diagnosis not present

## 2018-12-21 DIAGNOSIS — H6692 Otitis media, unspecified, left ear: Secondary | ICD-10-CM | POA: Diagnosis not present

## 2018-12-21 DIAGNOSIS — H9202 Otalgia, left ear: Secondary | ICD-10-CM | POA: Diagnosis not present

## 2019-03-02 DIAGNOSIS — E559 Vitamin D deficiency, unspecified: Secondary | ICD-10-CM | POA: Diagnosis not present

## 2019-03-02 DIAGNOSIS — E7849 Other hyperlipidemia: Secondary | ICD-10-CM | POA: Diagnosis not present

## 2019-03-03 DIAGNOSIS — R82998 Other abnormal findings in urine: Secondary | ICD-10-CM | POA: Diagnosis not present

## 2019-03-09 DIAGNOSIS — R609 Edema, unspecified: Secondary | ICD-10-CM | POA: Diagnosis not present

## 2019-03-09 DIAGNOSIS — R7301 Impaired fasting glucose: Secondary | ICD-10-CM | POA: Diagnosis not present

## 2019-03-09 DIAGNOSIS — Z1331 Encounter for screening for depression: Secondary | ICD-10-CM | POA: Diagnosis not present

## 2019-03-09 DIAGNOSIS — Z0000 Encounter for general adult medical examination without abnormal findings: Secondary | ICD-10-CM | POA: Diagnosis not present

## 2019-03-09 DIAGNOSIS — M7502 Adhesive capsulitis of left shoulder: Secondary | ICD-10-CM | POA: Diagnosis not present

## 2019-03-09 DIAGNOSIS — M8580 Other specified disorders of bone density and structure, unspecified site: Secondary | ICD-10-CM | POA: Diagnosis not present

## 2019-03-09 DIAGNOSIS — E559 Vitamin D deficiency, unspecified: Secondary | ICD-10-CM | POA: Diagnosis not present

## 2019-03-09 DIAGNOSIS — E785 Hyperlipidemia, unspecified: Secondary | ICD-10-CM | POA: Diagnosis not present

## 2019-10-22 ENCOUNTER — Ambulatory Visit: Payer: Medicare Other

## 2019-10-27 ENCOUNTER — Ambulatory Visit: Payer: Medicare Other

## 2019-10-29 ENCOUNTER — Ambulatory Visit: Payer: Medicare Other | Attending: Internal Medicine

## 2019-10-29 DIAGNOSIS — Z23 Encounter for immunization: Secondary | ICD-10-CM

## 2019-10-29 NOTE — Progress Notes (Signed)
   Covid-19 Vaccination Clinic  Name:  Rachel Barker    MRN: [id number] DOB: [birth year]  10/29/2019  [name] was observed post Covid-19 immunization for 15 minutes without incidence. She was provided with Vaccine Information Sheet and instruction to access the V-Safe system.   [name] was instructed to call 911 with any severe reactions post vaccine: Marland Kitchen Difficulty breathing  . Swelling of your face and throat  . A fast heartbeat  . A bad rash all over your body  . Dizziness and weakness    Immunizations Administered    Name Date Dose VIS Date Route   Pfizer COVID-19 Vaccine 10/29/2019  8:26 AM 0.3 mL 09/02/2019 Intramuscular   Manufacturer: [place]   Lot: [id number]   [place]: [id number]

## 2019-11-23 ENCOUNTER — Ambulatory Visit: Payer: Medicare Other | Attending: Internal Medicine

## 2019-11-23 DIAGNOSIS — Z23 Encounter for immunization: Secondary | ICD-10-CM | POA: Insufficient documentation

## 2019-11-23 NOTE — Progress Notes (Signed)
   Covid-19 Vaccination Clinic  Name:  Rachel Barker    MRN: [id number] DOB: December 30, 1945  11/23/2019  Ms. Hillmann was observed post Covid-19 immunization for 15 minutes without incident. She was provided with Vaccine Information Sheet and instruction to access the V-Safe system.   Ms. Holbert was instructed to call 911 with any severe reactions post vaccine: Marland Kitchen Difficulty breathing  . Swelling of face and throat  . A fast heartbeat  . A bad rash all over body  . Dizziness and weakness   Immunizations Administered    Name Date Dose VIS Date Route   Pfizer COVID-19 Vaccine 11/23/2019  8:27 AM 0.3 mL 09/02/2019 Intramuscular   Manufacturer: [place]   Lot: [id number]   [place]: [id number]

## 2020-04-20 DIAGNOSIS — M859 Disorder of bone density and structure, unspecified: Secondary | ICD-10-CM | POA: Diagnosis not present

## 2020-04-20 DIAGNOSIS — E7849 Other hyperlipidemia: Secondary | ICD-10-CM | POA: Diagnosis not present

## 2020-04-20 DIAGNOSIS — R7301 Impaired fasting glucose: Secondary | ICD-10-CM | POA: Diagnosis not present

## 2020-04-27 ENCOUNTER — Other Ambulatory Visit: Payer: Self-pay | Admitting: Internal Medicine

## 2020-04-27 DIAGNOSIS — Z0000 Encounter for general adult medical examination without abnormal findings: Secondary | ICD-10-CM | POA: Diagnosis not present

## 2020-04-27 DIAGNOSIS — M8580 Other specified disorders of bone density and structure, unspecified site: Secondary | ICD-10-CM | POA: Diagnosis not present

## 2020-04-27 DIAGNOSIS — E559 Vitamin D deficiency, unspecified: Secondary | ICD-10-CM | POA: Diagnosis not present

## 2020-04-27 DIAGNOSIS — R7301 Impaired fasting glucose: Secondary | ICD-10-CM | POA: Diagnosis not present

## 2020-04-27 DIAGNOSIS — Z1331 Encounter for screening for depression: Secondary | ICD-10-CM | POA: Diagnosis not present

## 2020-04-27 DIAGNOSIS — Z1212 Encounter for screening for malignant neoplasm of rectum: Secondary | ICD-10-CM | POA: Diagnosis not present

## 2020-04-27 DIAGNOSIS — R609 Edema, unspecified: Secondary | ICD-10-CM | POA: Diagnosis not present

## 2020-05-09 ENCOUNTER — Ambulatory Visit
Admission: RE | Admit: 2020-05-09 | Discharge: 2020-05-09 | Disposition: A | Discharge status: Home / Self Care | Payer: Medicare Other | Source: Ambulatory Visit | Attending: Internal Medicine | Admitting: Internal Medicine

## 2020-05-09 ENCOUNTER — Other Ambulatory Visit: Payer: Self-pay

## 2020-05-09 DIAGNOSIS — M8589 Other specified disorders of bone density and structure, multiple sites: Secondary | ICD-10-CM | POA: Diagnosis not present

## 2020-05-09 DIAGNOSIS — Z780 Asymptomatic menopausal state: Secondary | ICD-10-CM | POA: Diagnosis not present

## 2020-05-09 DIAGNOSIS — M8580 Other specified disorders of bone density and structure, unspecified site: Secondary | ICD-10-CM

## 2020-06-06 DIAGNOSIS — Z1211 Encounter for screening for malignant neoplasm of colon: Secondary | ICD-10-CM | POA: Diagnosis not present

## 2020-06-06 DIAGNOSIS — Z1212 Encounter for screening for malignant neoplasm of rectum: Secondary | ICD-10-CM | POA: Diagnosis not present

## 2020-07-02 DIAGNOSIS — Z23 Encounter for immunization: Secondary | ICD-10-CM | POA: Diagnosis not present

## 2020-07-05 ENCOUNTER — Other Ambulatory Visit: Payer: Self-pay

## 2020-07-23 ENCOUNTER — Other Ambulatory Visit: Payer: Self-pay | Admitting: Gastroenterology

## 2020-07-23 DIAGNOSIS — R195 Other fecal abnormalities: Secondary | ICD-10-CM | POA: Diagnosis not present

## 2020-09-03 ENCOUNTER — Other Ambulatory Visit (HOSPITAL_COMMUNITY)
Admission: RE | Admit: 2020-09-03 | Discharge: 2020-09-03 | Disposition: A | Discharge status: Home / Self Care | Payer: Medicare Other | Source: Ambulatory Visit | Attending: Gastroenterology | Admitting: Gastroenterology

## 2020-09-03 DIAGNOSIS — Z01812 Encounter for preprocedural laboratory examination: Secondary | ICD-10-CM | POA: Insufficient documentation

## 2020-09-03 DIAGNOSIS — Z20822 Contact with and (suspected) exposure to covid-19: Secondary | ICD-10-CM | POA: Diagnosis not present

## 2020-09-03 LAB — SARS CORONAVIRUS 2 (TAT 6-24 HRS): SARS Coronavirus 2: NEGATIVE

## 2020-09-04 ENCOUNTER — Encounter (HOSPITAL_COMMUNITY): Payer: Self-pay | Admitting: Gastroenterology

## 2020-09-04 ENCOUNTER — Other Ambulatory Visit: Payer: Self-pay

## 2020-09-04 NOTE — Progress Notes (Signed)
Attempted to obtain medical history via telephone, unable to reach at this time. I left a voicemail to return pre surgical testing department's phone call.  

## 2020-09-06 ENCOUNTER — Encounter (HOSPITAL_COMMUNITY): Payer: Self-pay | Admitting: Gastroenterology

## 2020-09-06 ENCOUNTER — Other Ambulatory Visit: Payer: Self-pay

## 2020-09-06 ENCOUNTER — Ambulatory Visit (HOSPITAL_COMMUNITY): Payer: Medicare Other | Admitting: Certified Registered Nurse Anesthetist

## 2020-09-06 ENCOUNTER — Encounter (HOSPITAL_COMMUNITY)
Admission: RE | Disposition: A | Discharge status: Home / Self Care | Payer: Self-pay | Source: Home / Self Care | Attending: Gastroenterology

## 2020-09-06 ENCOUNTER — Ambulatory Visit (HOSPITAL_COMMUNITY)
Admission: RE | Admit: 2020-09-06 | Discharge: 2020-09-06 | Disposition: A | Discharge status: Home / Self Care | Payer: Medicare Other | Attending: Gastroenterology | Admitting: Gastroenterology

## 2020-09-06 DIAGNOSIS — Z885 Allergy status to narcotic agent status: Secondary | ICD-10-CM | POA: Insufficient documentation

## 2020-09-06 DIAGNOSIS — Z8349 Family history of other endocrine, nutritional and metabolic diseases: Secondary | ICD-10-CM | POA: Diagnosis not present

## 2020-09-06 DIAGNOSIS — K5730 Diverticulosis of large intestine without perforation or abscess without bleeding: Secondary | ICD-10-CM | POA: Diagnosis not present

## 2020-09-06 DIAGNOSIS — Z79899 Other long term (current) drug therapy: Secondary | ICD-10-CM | POA: Diagnosis not present

## 2020-09-06 DIAGNOSIS — D120 Benign neoplasm of cecum: Secondary | ICD-10-CM | POA: Diagnosis not present

## 2020-09-06 DIAGNOSIS — D122 Benign neoplasm of ascending colon: Secondary | ICD-10-CM | POA: Insufficient documentation

## 2020-09-06 DIAGNOSIS — R195 Other fecal abnormalities: Secondary | ICD-10-CM | POA: Diagnosis not present

## 2020-09-06 DIAGNOSIS — K635 Polyp of colon: Secondary | ICD-10-CM | POA: Diagnosis not present

## 2020-09-06 DIAGNOSIS — E7800 Pure hypercholesterolemia, unspecified: Secondary | ICD-10-CM | POA: Diagnosis not present

## 2020-09-06 DIAGNOSIS — Z87891 Personal history of nicotine dependence: Secondary | ICD-10-CM | POA: Diagnosis not present

## 2020-09-06 HISTORY — DX: Nausea with vomiting, unspecified: R11.2

## 2020-09-06 HISTORY — PX: HEMOSTASIS CLIP PLACEMENT: SHX6857

## 2020-09-06 HISTORY — PX: POLYPECTOMY: SHX5525

## 2020-09-06 HISTORY — PX: BIOPSY: SHX5522

## 2020-09-06 HISTORY — DX: Other specified postprocedural states: Z98.890

## 2020-09-06 HISTORY — PX: COLONOSCOPY WITH PROPOFOL: SHX5780

## 2020-09-06 HISTORY — DX: Presence of spectacles and contact lenses: Z97.3

## 2020-09-06 SURGERY — COLONOSCOPY WITH PROPOFOL
Anesthesia: Monitor Anesthesia Care

## 2020-09-06 MED ORDER — PROPOFOL 500 MG/50ML IV EMUL
INTRAVENOUS | Status: DC | PRN
  Administered 2020-09-06: 50 mg via INTRAVENOUS
  Administered 2020-09-06: 100 ug/kg/min via INTRAVENOUS

## 2020-09-06 MED ORDER — SODIUM CHLORIDE 0.9 % IV SOLN: INTRAVENOUS | Status: DC

## 2020-09-06 MED ORDER — LACTATED RINGERS IV SOLN: INTRAVENOUS | Status: DC

## 2020-09-06 MED ORDER — PHENYLEPHRINE 40 MCG/ML (10ML) SYRINGE FOR IV PUSH (FOR BLOOD PRESSURE SUPPORT)
PREFILLED_SYRINGE | INTRAVENOUS | Status: DC | PRN
  Administered 2020-09-06: 40 ug via INTRAVENOUS

## 2020-09-06 SURGICAL SUPPLY — 21 items

## 2020-09-06 NOTE — Anesthesia Preprocedure Evaluation (Signed)
Anesthesia Evaluation  Patient identified by MRN, date of birth, ID band Patient awake    Reviewed: Allergy & Precautions, NPO status , Patient's Chart, lab work & pertinent test results  History of Anesthesia Complications (+) PONV and history of anesthetic complications (pt reports hypotension bradycardia with induction)  Airway Mallampati: II  TM Distance: >3 FB Neck ROM: Full    Dental  (+) Teeth Intact   Pulmonary neg pulmonary ROS, former smoker,    Pulmonary exam normal        Cardiovascular negative cardio ROS   Rhythm:Regular Rate:Normal     Neuro/Psych negative neurological ROS  negative psych ROS   GI/Hepatic Neg liver ROS, +cologuard   Endo/Other  negative endocrine ROS  Renal/GU   negative genitourinary   Musculoskeletal negative musculoskeletal ROS (+)   Abdominal (+)  Abdomen: soft. Bowel sounds: normal.  Peds  Hematology negative hematology ROS (+)   Anesthesia Other Findings   Reproductive/Obstetrics                             Anesthesia Physical Anesthesia Plan  ASA: II  Anesthesia Plan: MAC   Post-op Pain Management:    Induction: Intravenous  PONV Risk Score and Plan: 3 and Propofol infusion and Treatment may vary due to age or medical condition  Airway Management Planned: Simple Face Mask, Natural Airway and Nasal Cannula  Additional Equipment: None  Intra-op Plan:   Post-operative Plan:   Informed Consent: I have reviewed the patients History and Physical, chart, labs and discussed the procedure including the risks, benefits and alternatives for the proposed anesthesia with the patient or authorized representative who has indicated his/her understanding and acceptance.     Dental advisory given  Plan Discussed with: CRNA  Anesthesia Plan Comments:         Anesthesia Quick Evaluation

## 2020-09-06 NOTE — Interval H&P Note (Signed)
History and Physical Interval Note: 74/female for a diagnostic colonoscopy for a positive cologuard test.  09/06/2020 7:42 AM  Rachel Barker  has presented today for colonoscopy, with the diagnosis of positive cologard.  The various methods of treatment have been discussed with the patient and family. After consideration of risks, benefits and other options for treatment, the patient has consented to  Procedure(s): COLONOSCOPY WITH PROPOFOL (N/A) as a surgical intervention.  The patient's history has been reviewed, patient examined, no change in status, stable for surgery.  I have reviewed the patient's chart and labs.  Questions were answered to the patient's satisfaction.     Ronnette Juniper

## 2020-09-06 NOTE — H&P (Signed)
Rachel Barker is an 74 y.o. female.   Chief Complaint: Positive Cologuard test HPI: No prior colonoscopy Positive Cologuard test  Past Medical History:  Diagnosis Date  . Hypercholesteremia   . PONV (postoperative nausea and vomiting)    Blood pressure and heart rate drops with anesthesia  . Wears glasses     Past Surgical History:  Procedure Laterality Date  . ABDOMINAL HYSTERECTOMY    . BRAIN SURGERY      Family History  Problem Relation Age of Onset  . Hyperlipidemia Mother   . Hyperlipidemia Father    Social History:  reports that she has quit smoking. She has never used smokeless tobacco. She reports current alcohol use of about 1.0 standard drink of alcohol per week. She reports that she does not use drugs.  Allergies:  Allergies  Allergen Reactions  . Fentanyl Itching and Swelling    Edema/ cannot urinate  . Hydrocodone-Acetaminophen Hives and Itching  . Oxycodone-Acetaminophen Hives and Itching    Medications Prior to Admission  Medication Sig Dispense Refill  . cholecalciferol (VITAMIN D) 25 MCG (1000 UNIT) tablet Take 1,000 Units by mouth at bedtime.    . Coenzyme Q10 200 MG capsule Take 200 mg by mouth at bedtime.    Marland Kitchen ezetimibe (ZETIA) 10 MG tablet Take 10 mg by mouth at bedtime.    . simvastatin (ZOCOR) 40 MG tablet Take 40 mg by mouth at bedtime.    . vitamin B-12 (CYANOCOBALAMIN) 1000 MCG tablet Take 1,000 mcg by mouth at bedtime.      No results found for this or any previous visit (from the past 48 hour(s)). No results found.  Review of Systems  Constitutional: Negative.   HENT: Negative.   Eyes: Negative.   Respiratory: Negative.   Cardiovascular: Negative.   Gastrointestinal: Negative.   Neurological: Negative.     Blood pressure 132/60, pulse 66, temperature 98.1 F (36.7 C), temperature source Oral, resp. rate 19, height 5\' 4"  (1.626 m), weight 61.2 kg, SpO2 99 %. Physical Exam Constitutional:      Appearance: Normal appearance.   HENT:     Head: Normocephalic and atraumatic.  Eyes:     Conjunctiva/sclera: Conjunctivae normal.  Cardiovascular:     Rate and Rhythm: Normal rate and regular rhythm.     Pulses: Normal pulses.  Pulmonary:     Effort: Pulmonary effort is normal.  Abdominal:     General: Abdomen is flat.     Palpations: Abdomen is soft.  Skin:    General: Skin is warm.  Neurological:     General: No focal deficit present.     Mental Status: She is alert and oriented to person, place, and time.  Psychiatric:        Mood and Affect: Mood normal.        Behavior: Behavior normal.      Assessment/Plan Positive Cologuard test Diagnostic colonoscopy The risks and the benefits of the procedure were discussed with the patient in details. She understands and verbalizes consent.  Ronnette Juniper, MD 09/06/2020, 7:41 AM

## 2020-09-06 NOTE — Anesthesia Postprocedure Evaluation (Signed)
Anesthesia Post Note  Patient: Rachel Barker  Procedure(s) Performed: COLONOSCOPY WITH PROPOFOL (N/A ) BIOPSY POLYPECTOMY HEMOSTASIS CLIP PLACEMENT     Patient location during evaluation: Endoscopy Anesthesia Type: MAC Level of consciousness: awake and alert Pain management: pain level controlled Vital Signs Assessment: post-procedure vital signs reviewed and stable Respiratory status: spontaneous breathing, nonlabored ventilation, respiratory function stable and patient connected to nasal cannula oxygen Cardiovascular status: stable and blood pressure returned to baseline Postop Assessment: no apparent nausea or vomiting Anesthetic complications: no   No complications documented.  Last Vitals:  Vitals:   09/06/20 0910 09/06/20 0920  BP: (!) 109/59 (!) 148/39  Pulse: 73 (!) 58  Resp: (!) 23 12  Temp:    SpO2: 94% 100%    Last Pain:  Vitals:   09/06/20 0920  TempSrc:   PainSc: 0-No pain   Pain Goal:                   March Rummage Henley Blyth

## 2020-09-06 NOTE — Brief Op Note (Signed)
09/06/2020  8:56 AM  PATIENT:  Rachel Barker  74 y.o. female  PRE-OPERATIVE DIAGNOSIS:  positive cologard  POST-OPERATIVE DIAGNOSIS:  diverticulosis,colon polpys  PROCEDURE:  Procedure(s): COLONOSCOPY WITH PROPOFOL (N/A) BIOPSY POLYPECTOMY HEMOSTASIS CLIP PLACEMENT  SURGEON:  Surgeon(s) and Role:    Ronnette Juniper, MD - Primary  PHYSICIAN ASSISTANT:   ASSISTANTS: Kathrene Bongo   ANESTHESIA:   MAC  EBL:  Minimal  BLOOD ADMINISTERED:none  DRAINS: none   LOCAL MEDICATIONS USED:  NONE  SPECIMEN:  Biopsy / Limited Resection  DISPOSITION OF SPECIMEN:  PATHOLOGY  COUNTS:  YES  TOURNIQUET:  * No tourniquets in log *  DICTATION: .Dragon Dictation  PLAN OF CARE: Discharge to home after PACU  PATIENT DISPOSITION:  PACU - hemodynamically stable.   Delay start of Pharmacological VTE agent (>24hrs) due to surgical blood loss or risk of bleeding: not applicable

## 2020-09-06 NOTE — Transfer of Care (Signed)
Immediate Anesthesia Transfer of Care Note  Patient: Rachel Barker  Procedure(s) Performed: Procedure(s): COLONOSCOPY WITH PROPOFOL (N/A) BIOPSY POLYPECTOMY HEMOSTASIS CLIP PLACEMENT  Patient Location: PACU and Endoscopy Unit  Anesthesia Type:MAC  Level of Consciousness: awake, alert  and oriented  Airway & Oxygen Therapy: Patient Spontanous Breathing and Patient connected to nasal cannula oxygen  Post-op Assessment: Report given to RN and Post -op Vital signs reviewed and stable  Post vital signs: Reviewed and stable  Last Vitals:  Vitals:   09/06/20 0705  BP: 132/60  Pulse: 66  Resp: 19  Temp: 36.7 C  SpO2: 02%    Complications: No apparent anesthesia complications

## 2020-09-06 NOTE — Op Note (Signed)
[facility] Patient Name: Rachel Barker Procedure Date: 09/06/2020 MRN: [id number] Attending MD: Ronnette Juniper , MD Date of Birth: 1946/05/28 CSN: [id number] Age: 74 Admit Type: Outpatient Procedure:                Colonoscopy Indications:              This is the patient's first colonoscopy, Positive                            Cologuard test Providers:                Ronnette Juniper, MD, Cleda Daub, RN, Janie Billups,                            Jerene Bears CRNA Referring MD:             Jeannette How. Perini, MD Medicines:                Monitored Anesthesia Care Complications:            No immediate complications. Estimated blood loss:                            Minimal. Estimated Blood Loss:     Estimated blood loss was minimal. Procedure:                Pre-Anesthesia Assessment:                           - Prior to the procedure, a History and Physical                            was performed, and patient medications and                            allergies were reviewed. The patient's tolerance of                            previous anesthesia was also reviewed. The risks                            and benefits of the procedure and the sedation                            options and risks were discussed with the patient.                            All questions were answered, and informed consent                            was obtained. Prior Anticoagulants: The patient has                            taken no previous anticoagulant or antiplatelet                            agents. ASA  Grade Assessment: II - A patient with                            mild systemic disease. After reviewing the risks                            and benefits, the patient was deemed in                            satisfactory condition to undergo the procedure.                           After obtaining informed consent, the colonoscope                            was passed under  direct vision. Throughout the                            procedure, the patient's blood pressure, pulse, and                            oxygen saturations were monitored continuously. The                            [id number]-PH190L ([id number]) Olympus ultra slim endoscope                            was introduced through the anus and advanced to the                            the cecum, identified by appendiceal orifice and                            ileocecal valve. The colonoscopy was performed                            without difficulty. The patient tolerated the                            procedure well. The quality of the bowel                            preparation was fair.                           The procedure was started with a pediatric                            colonoscope, however, due to significant                            diverticular disease and looping, the scope had to  be exchanged for an ultraslim colonoscope. Scope In: 7:52:50 AM Scope Out: 8:48:46 AM Scope Withdrawal Time: 0 hours 36 minutes 45 seconds  Total Procedure Duration: 0 hours 55 minutes 56 seconds  Findings:      The perianal and digital rectal examinations were normal.      Multiple small and large-mouthed diverticula were found in the       recto-sigmoid colon, sigmoid colon, descending colon, transverse colon       and hepatic flexure.      Seven sessile polyps were found in the ascending colon(#4) and       cecum(#3). The polyps were 5 to 30 mm in size. These polyps were removed       with a hot snare and cold biopsy forceps. Resection and retrieval were       complete. To close a defect after mucosal resection after removal of the       largest 3 cm ascending colon polyp, three hemostatic clips were       successfully placed (MR conditional). There was no bleeding at the end       of the procedure.      A moderate amount of liquid semi-liquid semi-solid stool was found in        the sigmoid colon, in the descending colon, in the transverse colon and       at the hepatic flexure, making visualization difficult. Lavage of the       area was performed, resulting in clearance with fair visualization.      The exam was otherwise without abnormality on direct and retroflexion       views. Impression:               - Preparation of the colon was fair.                           - Diverticulosis in the recto-sigmoid colon, in the                            sigmoid colon, in the descending colon, in the                            transverse colon and at the hepatic flexure.                           - Seven 5 to 30 mm polyps in the ascending colon                            and in the cecum, removed with a hot snare.                            Resected and retrieved. Clips (MR conditional) were                            placed.                           - Stool in the sigmoid colon, in the descending  colon, in the transverse colon and at the hepatic                            flexure.                           - The examination was otherwise normal on direct                            and retroflexion views. Moderate Sedation:      Patient did not receive moderate sedation for this procedure, but       instead received monitored anesthesia care. Recommendation:           - Patient has a contact number available for                            emergencies. The signs and symptoms of potential                            delayed complications were discussed with the                            patient. Return to normal activities tomorrow.                            Written discharge instructions were provided to the                            patient.                           - High fiber diet.                           - Continue present medications.                           - Await pathology results.                           - Repeat  colonoscopy for surveillance based on                            pathology results. Procedure Code(s):        --- Professional ---                           [phone], Colonoscopy, flexible; with removal of                            tumor(s), polyp(s), or other lesion(s) by snare                            technique Diagnosis Code(s):        --- Professional ---  K63.5, Polyp of colon                           R19.5, Other fecal abnormalities                           K57.30, Diverticulosis of large intestine without                            perforation or abscess without bleeding CPT copyright 2019 [employer] Association. All rights reserved. The codes documented in this report are preliminary and upon coder review may  be revised to meet current compliance requirements. Ronnette Juniper, MD 09/06/2020 8:55:57 AM This report has been signed electronically. Number of Addenda: 0

## 2020-09-06 NOTE — Discharge Instructions (Signed)

## 2020-09-07 ENCOUNTER — Encounter (HOSPITAL_COMMUNITY): Payer: Self-pay | Admitting: Gastroenterology

## 2020-09-07 LAB — SURGICAL PATHOLOGY

## 2021-05-30 DIAGNOSIS — E559 Vitamin D deficiency, unspecified: Secondary | ICD-10-CM | POA: Diagnosis not present

## 2021-05-30 DIAGNOSIS — R7301 Impaired fasting glucose: Secondary | ICD-10-CM | POA: Diagnosis not present

## 2021-05-30 DIAGNOSIS — E785 Hyperlipidemia, unspecified: Secondary | ICD-10-CM | POA: Diagnosis not present

## 2021-06-08 DIAGNOSIS — Z23 Encounter for immunization: Secondary | ICD-10-CM | POA: Diagnosis not present

## 2021-06-13 DIAGNOSIS — R609 Edema, unspecified: Secondary | ICD-10-CM | POA: Diagnosis not present

## 2021-06-13 DIAGNOSIS — Z0000 Encounter for general adult medical examination without abnormal findings: Secondary | ICD-10-CM | POA: Diagnosis not present

## 2021-06-13 DIAGNOSIS — Z1212 Encounter for screening for malignant neoplasm of rectum: Secondary | ICD-10-CM | POA: Diagnosis not present

## 2021-06-13 DIAGNOSIS — R82998 Other abnormal findings in urine: Secondary | ICD-10-CM | POA: Diagnosis not present

## 2021-06-13 DIAGNOSIS — Z1339 Encounter for screening examination for other mental health and behavioral disorders: Secondary | ICD-10-CM | POA: Diagnosis not present

## 2021-06-13 DIAGNOSIS — E785 Hyperlipidemia, unspecified: Secondary | ICD-10-CM | POA: Diagnosis not present

## 2021-06-13 DIAGNOSIS — R7301 Impaired fasting glucose: Secondary | ICD-10-CM | POA: Diagnosis not present

## 2021-06-13 DIAGNOSIS — Z1331 Encounter for screening for depression: Secondary | ICD-10-CM | POA: Diagnosis not present

## 2021-06-13 DIAGNOSIS — M8580 Other specified disorders of bone density and structure, unspecified site: Secondary | ICD-10-CM | POA: Diagnosis not present

## 2021-08-27 DIAGNOSIS — U071 COVID-19: Secondary | ICD-10-CM | POA: Diagnosis not present

## 2021-11-07 ENCOUNTER — Other Ambulatory Visit: Payer: Self-pay | Admitting: Gastroenterology

## 2022-01-14 ENCOUNTER — Encounter (HOSPITAL_COMMUNITY): Payer: Self-pay | Admitting: Gastroenterology

## 2022-01-14 NOTE — Progress Notes (Signed)
Attempted to obtain medical history via telephone, unable to reach at this time. I left a voicemail to return pre surgical testing department's phone call.  

## 2022-01-20 NOTE — Anesthesia Preprocedure Evaluation (Addendum)
Anesthesia Evaluation  ?Patient identified by MRN, date of birth, ID band ?Patient awake ? ? ? ?Reviewed: ?Allergy & Precautions, NPO status , Patient's Chart, lab work & pertinent test results ? ?History of Anesthesia Complications ?(+) PONV and history of anesthetic complications (per pt, hypotension and bradycardia to 30s with prior anesthetic. cscope record in 2021 looks normal ) ? ?Airway ?Mallampati: III ? ?TM Distance: >3 FB ?Neck ROM: Full ? ? ? Dental ? ?(+) Teeth Intact, Dental Advisory Given ?  ?Pulmonary ?former smoker,  ?  ?Pulmonary exam normal ?breath sounds clear to auscultation ? ? ? ? ? ? Cardiovascular ?negative cardio ROS ?Normal cardiovascular exam ?Rhythm:Regular Rate:Normal ? ? ?  ?Neuro/Psych ?negative neurological ROS ? negative psych ROS  ? GI/Hepatic ?negative GI ROS, Neg liver ROS,   ?Endo/Other  ?negative endocrine ROS ? Renal/GU ?negative Renal ROS  ?negative genitourinary ?  ?Musculoskeletal ?negative musculoskeletal ROS ?(+)  ? Abdominal ?  ?Peds ? Hematology ?negative hematology ROS ?(+)   ?Anesthesia Other Findings ? ? Reproductive/Obstetrics ?negative OB ROS ? ?  ? ? ? ? ? ? ? ? ? ? ? ? ? ?  ?  ? ? ? ? ? ? ? ?Anesthesia Physical ?Anesthesia Plan ? ?ASA: 2 ? ?Anesthesia Plan: MAC  ? ?Post-op Pain Management:   ? ?Induction:  ? ?PONV Risk Score and Plan: 2 and Propofol infusion and TIVA ? ?Airway Management Planned: Natural Airway and Simple Face Mask ? ?Additional Equipment: None ? ?Intra-op Plan:  ? ?Post-operative Plan:  ? ?Informed Consent: I have reviewed the patients History and Physical, chart, labs and discussed the procedure including the risks, benefits and alternatives for the proposed anesthesia with the patient or authorized representative who has indicated his/her understanding and acceptance.  ? ? ? ?Dental advisory given ? ?Plan Discussed with: CRNA ? ?Anesthesia Plan Comments:   ? ? ? ? ? ?Anesthesia Quick Evaluation ? ?

## 2022-01-20 NOTE — H&P (Signed)
Rachel Barker is an 76 y.o. female.   ?Chief Complaint: Positive Cologuard ?HPI: 76 year old female with a positive Cologuard from 10/21 ?No prior colonoscopy ?Great uncle with colon cancer in 34s ?She is a retired [employer] scientist ?Her heart rate has gone down to 30/min and she has experienced hypotension with prior anesthesia. ? ?Past Medical History:  ?Diagnosis Date  ? Hypercholesteremia   ? PONV (postoperative nausea and vomiting)   ? Blood pressure and heart rate drops with anesthesia  ? Wears glasses   ? ? ?Past Surgical History:  ?Procedure Laterality Date  ? ABDOMINAL HYSTERECTOMY    ? BIOPSY  09/06/2020  ? Procedure: BIOPSY;  Surgeon: Ronnette Juniper, MD;  Location: [facility];  Service: Gastroenterology;;  ? BRAIN SURGERY    ? COLONOSCOPY WITH PROPOFOL N/A 09/06/2020  ? Procedure: COLONOSCOPY WITH PROPOFOL;  Surgeon: Ronnette Juniper, MD;  Location: [facility];  Service: Gastroenterology;  Laterality: N/A;  ? HEMOSTASIS CLIP PLACEMENT  09/06/2020  ? Procedure: HEMOSTASIS CLIP PLACEMENT;  Surgeon: Ronnette Juniper, MD;  Location: [facility];  Service: Gastroenterology;;  ? POLYPECTOMY  09/06/2020  ? Procedure: POLYPECTOMY;  Surgeon: Ronnette Juniper, MD;  Location: [facility];  Service: Gastroenterology;;  ? ? ?Family History  ?Problem Relation Age of Onset  ? Hyperlipidemia Mother   ? Hyperlipidemia Father   ? ?Social History:  reports that she has quit smoking. She has never used smokeless tobacco. She reports current alcohol use of about 1.0 standard drink per week. She reports that she does not use drugs. ? ?Allergies:  ?Allergies  ?Allergen Reactions  ? Fentanyl Itching and Swelling  ?  Edema/ cannot urinate  ? Hydrocodone-Acetaminophen Hives and Itching  ? Oxycodone-Acetaminophen Hives and Itching  ? ? ?No medications prior to admission.  ? ? ?No results found for this or any previous visit (from the past 48 hour(s)). ?No results found. ? ?Review of Systems  ?Constitutional: Negative.   ?Respiratory: Negative.     ?Cardiovascular: Negative.   ?Gastrointestinal: Negative.   ? ?There were no vitals taken for this visit. ?Physical Exam ?Constitutional:   ?   Appearance: Normal appearance.  ?Eyes:  ?   Conjunctiva/sclera: Conjunctivae normal.  ?Cardiovascular:  ?   Rate and Rhythm: Normal rate and regular rhythm.  ?   Pulses: Normal pulses.  ?   Heart sounds: Normal heart sounds.  ?Abdominal:  ?   General: Bowel sounds are normal.  ?   Palpations: Abdomen is soft.  ?Skin: ?   General: Skin is warm and dry.  ?Neurological:  ?   General: No focal deficit present.  ?   Mental Status: She is alert and oriented to person, place, and time.  ?Psychiatric:     ?   Mood and Affect: Mood normal.     ?   Behavior: Behavior normal.  ?  ? ?Assessment/Plan ?Positive Cologuard ?Diagnostic colonoscopy ? ?Ronnette Juniper, MD ?01/20/2022, 3:07 PM ? ? ? ?

## 2022-01-21 ENCOUNTER — Encounter (HOSPITAL_COMMUNITY)
Admission: RE | Disposition: A | Discharge status: Home / Self Care | Payer: Self-pay | Source: Home / Self Care | Attending: Gastroenterology

## 2022-01-21 ENCOUNTER — Ambulatory Visit (HOSPITAL_COMMUNITY)
Admission: RE | Admit: 2022-01-21 | Discharge: 2022-01-21 | Disposition: A | Discharge status: Home / Self Care | Payer: Medicare Other | Attending: Gastroenterology | Admitting: Gastroenterology

## 2022-01-21 ENCOUNTER — Other Ambulatory Visit: Payer: Self-pay

## 2022-01-21 ENCOUNTER — Encounter (HOSPITAL_COMMUNITY): Payer: Self-pay | Admitting: Gastroenterology

## 2022-01-21 ENCOUNTER — Ambulatory Visit (HOSPITAL_BASED_OUTPATIENT_CLINIC_OR_DEPARTMENT_OTHER): Payer: Medicare Other | Admitting: Certified Registered Nurse Anesthetist

## 2022-01-21 ENCOUNTER — Ambulatory Visit (HOSPITAL_COMMUNITY): Payer: Medicare Other | Admitting: Certified Registered Nurse Anesthetist

## 2022-01-21 DIAGNOSIS — K635 Polyp of colon: Secondary | ICD-10-CM

## 2022-01-21 DIAGNOSIS — D123 Benign neoplasm of transverse colon: Secondary | ICD-10-CM | POA: Diagnosis not present

## 2022-01-21 DIAGNOSIS — Z87891 Personal history of nicotine dependence: Secondary | ICD-10-CM | POA: Insufficient documentation

## 2022-01-21 DIAGNOSIS — K5730 Diverticulosis of large intestine without perforation or abscess without bleeding: Secondary | ICD-10-CM

## 2022-01-21 DIAGNOSIS — Z86010 Personal history of colonic polyps: Secondary | ICD-10-CM | POA: Insufficient documentation

## 2022-01-21 DIAGNOSIS — Z800 Family history of malignant neoplasm of digestive organs: Secondary | ICD-10-CM | POA: Insufficient documentation

## 2022-01-21 DIAGNOSIS — D122 Benign neoplasm of ascending colon: Secondary | ICD-10-CM | POA: Insufficient documentation

## 2022-01-21 DIAGNOSIS — Z09 Encounter for follow-up examination after completed treatment for conditions other than malignant neoplasm: Secondary | ICD-10-CM | POA: Diagnosis present

## 2022-01-21 HISTORY — PX: BIOPSY: SHX5522

## 2022-01-21 HISTORY — PX: COLONOSCOPY: SHX5424

## 2022-01-21 SURGERY — COLONOSCOPY
Anesthesia: Monitor Anesthesia Care

## 2022-01-21 MED ORDER — LIDOCAINE 2% (20 MG/ML) 5 ML SYRINGE
INTRAMUSCULAR | Status: DC | PRN
  Administered 2022-01-21: 60 mg via INTRAVENOUS

## 2022-01-21 MED ORDER — PROPOFOL 500 MG/50ML IV EMUL
INTRAVENOUS | Status: DC | PRN
Start: 2022-01-21 — End: 2022-01-21
  Administered 2022-01-21: 125 ug/kg/min via INTRAVENOUS

## 2022-01-21 MED ORDER — PROPOFOL 10 MG/ML IV BOLUS
INTRAVENOUS | Status: DC | PRN
  Administered 2022-01-21: 30 mg via INTRAVENOUS
  Administered 2022-01-21: 40 mg via INTRAVENOUS
  Administered 2022-01-21: 30 mg via INTRAVENOUS

## 2022-01-21 MED ORDER — LACTATED RINGERS IV SOLN
INTRAVENOUS | Status: DC
Start: 2022-01-21 — End: 2022-01-21

## 2022-01-21 MED ORDER — SODIUM CHLORIDE 0.9 % IV SOLN: INTRAVENOUS | Status: DC

## 2022-01-21 NOTE — Anesthesia Postprocedure Evaluation (Signed)
Anesthesia Post Note ? ?Patient: Rachel Barker ? ?Procedure(s) Performed: COLONOSCOPY ?BIOPSY ? ?  ? ?Patient location during evaluation: PACU ?Anesthesia Type: MAC ?Level of consciousness: awake and alert ?Pain management: pain level controlled ?Vital Signs Assessment: post-procedure vital signs reviewed and stable ?Respiratory status: spontaneous breathing, nonlabored ventilation and respiratory function stable ?Cardiovascular status: blood pressure returned to baseline and stable ?Postop Assessment: no apparent nausea or vomiting ?Anesthetic complications: no ? ? ?No notable events documented. ? ?Last Vitals:  ?Vitals:  ? 01/21/22 1030 01/21/22 1041  ?BP: 124/71 (!) 127/53  ?Pulse: 63 (!) 57  ?Resp: 15 14  ?Temp:    ?SpO2: 96% 99%  ?  ?Last Pain:  ?Vitals:  ? 01/21/22 1025  ?TempSrc: Temporal  ?PainSc:   ? ? ?  ?  ?  ?  ?  ?  ? ?Rachel Barker ? ? ? ? ?

## 2022-01-21 NOTE — Interval H&P Note (Signed)
History and Physical Interval Note: ?75/female with 7 tubular adenomas removal in 12/21, largest polyp was 3 cm in size for a surveillance colonoscopy with propofol. ?01/21/2022 ?8:28 AM ? ?Rachel Barker  has presented today for surgery, with the diagnosis of K63.5 Colon polyp/Z86.010 Personal history colon polyps.  The various methods of treatment have been discussed with the patient and family. After consideration of risks, benefits and other options for treatment, the patient has consented to  Procedure(s): ?COLONOSCOPY (N/A) as a surgical intervention.  The patient's history has been reviewed, patient examined, no change in status, stable for surgery.  I have reviewed the patient's chart and labs.  Questions were answered to the patient's satisfaction.   ? ? ?Ronnette Juniper ? ? ?

## 2022-01-21 NOTE — Op Note (Signed)
[facility] ?Patient Name: Rachel Barker ?Procedure Date: 01/21/2022 ?MRN: [id number] ?Attending MD: Ronnette Juniper , MD ?Date of Birth: Feb 14, 1946 ?CSN: [id number] ?Age: 76 ?Admit Type: Inpatient ?Procedure:                Colonoscopy ?Indications:              High risk colon cancer surveillance: Personal  ?                          history of adenoma (10 mm or greater in size), Last  ?                          colonoscopy: December 2021 ?Providers:                Ronnette Juniper, MD, Ladoris Gene, RN, Elberta Fortis  ?                          Johnella Moloney, Technician ?Referring MD:             Elta Guadeloupe Perini,MD ?Medicines:                Monitored Anesthesia Care ?Complications:            No immediate complications. Estimated blood loss:  ?                          Minimal. ?Estimated Blood Loss:     Estimated blood loss was minimal. ?Procedure:                Pre-Anesthesia Assessment: ?                          - Prior to the procedure, a History and Physical  ?                          was performed, and patient medications and  ?                          allergies were reviewed. The patient's tolerance of  ?                          previous anesthesia was also reviewed. The risks  ?                          and benefits of the procedure and the sedation  ?                          options and risks were discussed with the patient.  ?                          All questions were answered, and informed consent  ?                          was obtained. Prior Anticoagulants: The patient has  ?                          taken no  previous anticoagulant or antiplatelet  ?                          agents. ASA Grade Assessment: II - A patient with  ?                          mild systemic disease. After reviewing the risks  ?                          and benefits, the patient was deemed in  ?                          satisfactory condition to undergo the procedure. ?                          After obtaining informed  consent, the colonoscope  ?                          was passed under direct vision. Throughout the  ?                          procedure, the patient's blood pressure, pulse, and  ?                          oxygen saturations were monitored continuously. The  ?                          [id number]-HQ190L ([id number]) Olympus colonoscope was  ?                          introduced through the anus and advanced to the the  ?                          terminal ileum. The colonoscopy was performed  ?                          without difficulty. The patient tolerated the  ?                          procedure well. The quality of the bowel  ?                          preparation was adequate to identify polyps 6 mm  ?                          and larger in size. ?Scope In: 9:55:23 AM ?Scope Out: 10:14:07 AM ?Scope Withdrawal Time: 0 hours 10 minutes 54 seconds  ?Total Procedure Duration: 0 hours 18 minutes 44 seconds  ?Findings: ?     The perianal and digital rectal examinations were normal. ?     Four sessile polyps were found in the transverse colon and ascending  ?     colon. The polyps were 4 to 5 mm in size. These polyps were removed with  ?     a piecemeal technique using a cold  biopsy forceps. Resection and  ?     retrieval were complete. ?     The terminal ileum appeared normal. ?     Multiple small and large-mouthed diverticula were found in the sigmoid  ?     colon, descending colon and transverse colon. ?     The exam was otherwise without abnormality on direct and retroflexion  ?     views. ?Impression:               - Four 4 to 5 mm polyps in the transverse colon and  ?                          in the ascending colon, removed piecemeal using a  ?                          cold biopsy forceps. Resected and retrieved. ?                          - The examined portion of the ileum was normal. ?                          - Diverticulosis in the sigmoid colon, in the  ?                          descending colon and in the  transverse colon. ?                          - The examination was otherwise normal on direct  ?                          and retroflexion views. ?Moderate Sedation: ?     Patient did not receive moderate sedation for this procedure, but  ?     instead received monitored anesthesia care. ?Recommendation:           - Patient has a contact number available for  ?                          emergencies. The signs and symptoms of potential  ?                          delayed complications were discussed with the  ?                          patient. Return to normal activities tomorrow.  ?                          Written discharge instructions were provided to the  ?                          patient. ?                          - High fiber diet. ?                          -  Continue present medications. ?                          - Await pathology results. ?                          - Repeat colonoscopy for surveillance based on  ?                          pathology results. ?Procedure Code(s):        --- Professional --- ?                          [phone], Colonoscopy, flexible; with biopsy, single  ?                          or multiple ?Diagnosis Code(s):        --- Professional --- ?                          Z86.010, Personal history of colonic polyps ?                          K63.5, Polyp of colon ?                          K57.30, Diverticulosis of large intestine without  ?                          perforation or abscess without bleeding ?CPT copyright 2019 [employer] Medical Association. All rights reserved. ?The codes documented in this report are preliminary and upon coder review may  ?be revised to meet current compliance requirements. ?Ronnette Juniper, MD ?01/21/2022 10:20:04 AM ?This report has been signed electronically. ?Number of Addenda: 0 ?

## 2022-01-21 NOTE — Discharge Instructions (Signed)

## 2022-01-21 NOTE — Anesthesia Procedure Notes (Signed)
Procedure Name: [place] ?Date/Time: 01/21/2022 9:50 AM ?Performed by: Claudia Desanctis, CRNA ?Pre-anesthesia Checklist: Patient identified, Emergency Drugs available, Suction available and Patient being monitored ?Patient Re-evaluated:Patient Re-evaluated prior to induction ?Oxygen Delivery Method: Simple face mask ? ? ? ? ?

## 2022-01-21 NOTE — Transfer of Care (Signed)
Immediate Anesthesia Transfer of Care Note ? ?Patient: Rachel Barker ? ?Procedure(s) Performed: COLONOSCOPY ?BIOPSY ? ?Patient Location: PACU ? ?Anesthesia Type:MAC ? ?Level of Consciousness: awake, alert , oriented and patient cooperative ? ?Airway & Oxygen Therapy: Patient Spontanous Breathing and Patient connected to face mask ? ?Post-op Assessment: Report given to RN and Post -op Vital signs reviewed and stable ? ?Post vital signs: Reviewed and stable ? ?Last Vitals:  ?Vitals Value Taken Time  ?BP    ?Temp    ?Pulse    ?Resp 17 01/21/22 1020  ?SpO2    ?Vitals shown include unvalidated device data. ? ?Last Pain:  ?Vitals:  ? 01/21/22 0807  ?TempSrc: Temporal  ?PainSc: 0-No pain  ?   ? ?  ? ?Complications: No notable events documented. ?

## 2022-01-22 ENCOUNTER — Encounter (HOSPITAL_COMMUNITY): Payer: Self-pay | Admitting: Gastroenterology

## 2022-01-22 LAB — SURGICAL PATHOLOGY

## 2022-06-02 DIAGNOSIS — L309 Dermatitis, unspecified: Secondary | ICD-10-CM | POA: Diagnosis not present

## 2022-06-02 DIAGNOSIS — L810 Postinflammatory hyperpigmentation: Secondary | ICD-10-CM | POA: Diagnosis not present

## 2022-06-02 DIAGNOSIS — L02426 Furuncle of left lower limb: Secondary | ICD-10-CM | POA: Diagnosis not present

## 2022-06-02 DIAGNOSIS — L02221 Furuncle of abdominal wall: Secondary | ICD-10-CM | POA: Diagnosis not present

## 2022-06-02 DIAGNOSIS — L821 Other seborrheic keratosis: Secondary | ICD-10-CM | POA: Diagnosis not present

## 2022-06-02 DIAGNOSIS — D225 Melanocytic nevi of trunk: Secondary | ICD-10-CM | POA: Diagnosis not present

## 2022-06-02 DIAGNOSIS — L814 Other melanin hyperpigmentation: Secondary | ICD-10-CM | POA: Diagnosis not present

## 2022-07-11 DIAGNOSIS — E559 Vitamin D deficiency, unspecified: Secondary | ICD-10-CM | POA: Diagnosis not present

## 2022-07-11 DIAGNOSIS — E785 Hyperlipidemia, unspecified: Secondary | ICD-10-CM | POA: Diagnosis not present

## 2022-07-11 DIAGNOSIS — N200 Calculus of kidney: Secondary | ICD-10-CM | POA: Diagnosis not present

## 2022-07-11 DIAGNOSIS — M8580 Other specified disorders of bone density and structure, unspecified site: Secondary | ICD-10-CM | POA: Diagnosis not present

## 2022-07-11 DIAGNOSIS — R7989 Other specified abnormal findings of blood chemistry: Secondary | ICD-10-CM | POA: Diagnosis not present

## 2022-07-11 DIAGNOSIS — R7301 Impaired fasting glucose: Secondary | ICD-10-CM | POA: Diagnosis not present

## 2022-07-18 DIAGNOSIS — R82998 Other abnormal findings in urine: Secondary | ICD-10-CM | POA: Diagnosis not present

## 2022-07-18 DIAGNOSIS — Z1339 Encounter for screening examination for other mental health and behavioral disorders: Secondary | ICD-10-CM | POA: Diagnosis not present

## 2022-07-18 DIAGNOSIS — Z0000 Encounter for general adult medical examination without abnormal findings: Secondary | ICD-10-CM | POA: Diagnosis not present

## 2022-07-18 DIAGNOSIS — Z1331 Encounter for screening for depression: Secondary | ICD-10-CM | POA: Diagnosis not present

## 2022-07-18 DIAGNOSIS — M8580 Other specified disorders of bone density and structure, unspecified site: Secondary | ICD-10-CM | POA: Diagnosis not present

## 2022-07-18 DIAGNOSIS — L0292 Furuncle, unspecified: Secondary | ICD-10-CM | POA: Diagnosis not present

## 2022-07-18 DIAGNOSIS — E785 Hyperlipidemia, unspecified: Secondary | ICD-10-CM | POA: Diagnosis not present

## 2022-07-22 ENCOUNTER — Other Ambulatory Visit: Payer: Self-pay | Admitting: Internal Medicine

## 2022-07-22 DIAGNOSIS — E785 Hyperlipidemia, unspecified: Secondary | ICD-10-CM

## 2022-07-28 DIAGNOSIS — Z23 Encounter for immunization: Secondary | ICD-10-CM | POA: Diagnosis not present

## 2022-07-30 DIAGNOSIS — R3121 Asymptomatic microscopic hematuria: Secondary | ICD-10-CM | POA: Diagnosis not present

## 2022-09-02 ENCOUNTER — Ambulatory Visit
Admission: RE | Admit: 2022-09-02 | Discharge: 2022-09-02 | Disposition: A | Discharge status: Home / Self Care | Payer: No Typology Code available for payment source | Source: Ambulatory Visit | Attending: Internal Medicine | Admitting: Internal Medicine

## 2022-09-02 DIAGNOSIS — E785 Hyperlipidemia, unspecified: Secondary | ICD-10-CM

## 2023-10-20 DIAGNOSIS — E785 Hyperlipidemia, unspecified: Secondary | ICD-10-CM | POA: Diagnosis not present

## 2023-10-20 DIAGNOSIS — R7301 Impaired fasting glucose: Secondary | ICD-10-CM | POA: Diagnosis not present

## 2023-10-20 DIAGNOSIS — Z1212 Encounter for screening for malignant neoplasm of rectum: Secondary | ICD-10-CM | POA: Diagnosis not present

## 2023-10-20 DIAGNOSIS — E559 Vitamin D deficiency, unspecified: Secondary | ICD-10-CM | POA: Diagnosis not present

## 2023-10-20 DIAGNOSIS — M8580 Other specified disorders of bone density and structure, unspecified site: Secondary | ICD-10-CM | POA: Diagnosis not present

## 2023-10-27 DIAGNOSIS — H532 Diplopia: Secondary | ICD-10-CM | POA: Diagnosis not present

## 2023-10-27 DIAGNOSIS — E559 Vitamin D deficiency, unspecified: Secondary | ICD-10-CM | POA: Diagnosis not present

## 2023-10-27 DIAGNOSIS — R82998 Other abnormal findings in urine: Secondary | ICD-10-CM | POA: Diagnosis not present

## 2023-10-27 DIAGNOSIS — E785 Hyperlipidemia, unspecified: Secondary | ICD-10-CM | POA: Diagnosis not present

## 2023-10-27 DIAGNOSIS — R911 Solitary pulmonary nodule: Secondary | ICD-10-CM | POA: Diagnosis not present

## 2023-10-27 DIAGNOSIS — Z0000 Encounter for general adult medical examination without abnormal findings: Secondary | ICD-10-CM | POA: Diagnosis not present

## 2023-10-27 DIAGNOSIS — M7502 Adhesive capsulitis of left shoulder: Secondary | ICD-10-CM | POA: Diagnosis not present

## 2023-10-27 DIAGNOSIS — R609 Edema, unspecified: Secondary | ICD-10-CM | POA: Diagnosis not present

## 2023-10-27 DIAGNOSIS — D7589 Other specified diseases of blood and blood-forming organs: Secondary | ICD-10-CM | POA: Diagnosis not present

## 2023-10-27 DIAGNOSIS — R3121 Asymptomatic microscopic hematuria: Secondary | ICD-10-CM | POA: Diagnosis not present

## 2023-10-27 DIAGNOSIS — M8580 Other specified disorders of bone density and structure, unspecified site: Secondary | ICD-10-CM | POA: Diagnosis not present

## 2023-10-27 DIAGNOSIS — R7301 Impaired fasting glucose: Secondary | ICD-10-CM | POA: Diagnosis not present

## 2023-10-28 ENCOUNTER — Other Ambulatory Visit: Payer: Self-pay | Admitting: Internal Medicine

## 2023-10-28 DIAGNOSIS — R911 Solitary pulmonary nodule: Secondary | ICD-10-CM

## 2023-11-17 ENCOUNTER — Ambulatory Visit
Admission: RE | Admit: 2023-11-17 | Discharge: 2023-11-17 | Disposition: A | Discharge status: Home / Self Care | Payer: Medicare Other | Source: Ambulatory Visit | Attending: Internal Medicine | Admitting: Internal Medicine

## 2023-11-17 DIAGNOSIS — R911 Solitary pulmonary nodule: Secondary | ICD-10-CM

## 2023-11-17 DIAGNOSIS — M25511 Pain in right shoulder: Secondary | ICD-10-CM | POA: Diagnosis not present

## 2023-11-17 DIAGNOSIS — M19011 Primary osteoarthritis, right shoulder: Secondary | ICD-10-CM | POA: Diagnosis not present

## 2023-11-17 DIAGNOSIS — M7541 Impingement syndrome of right shoulder: Secondary | ICD-10-CM | POA: Diagnosis not present

## 2023-12-04 DIAGNOSIS — M25511 Pain in right shoulder: Secondary | ICD-10-CM | POA: Diagnosis not present

## 2023-12-09 DIAGNOSIS — M25511 Pain in right shoulder: Secondary | ICD-10-CM | POA: Diagnosis not present

## 2023-12-16 DIAGNOSIS — M25511 Pain in right shoulder: Secondary | ICD-10-CM | POA: Diagnosis not present

## 2023-12-23 DIAGNOSIS — M25511 Pain in right shoulder: Secondary | ICD-10-CM | POA: Diagnosis not present

## 2024-02-03 DIAGNOSIS — M25511 Pain in right shoulder: Secondary | ICD-10-CM | POA: Diagnosis not present

## 2024-02-25 DIAGNOSIS — M25511 Pain in right shoulder: Secondary | ICD-10-CM | POA: Diagnosis not present

## 2024-03-02 DIAGNOSIS — M25511 Pain in right shoulder: Secondary | ICD-10-CM | POA: Diagnosis not present

## 2024-03-21 DIAGNOSIS — M19011 Primary osteoarthritis, right shoulder: Secondary | ICD-10-CM | POA: Diagnosis not present

## 2024-04-12 DIAGNOSIS — H2512 Age-related nuclear cataract, left eye: Secondary | ICD-10-CM | POA: Diagnosis not present

## 2024-04-12 DIAGNOSIS — H25043 Posterior subcapsular polar age-related cataract, bilateral: Secondary | ICD-10-CM | POA: Diagnosis not present

## 2024-04-12 DIAGNOSIS — H25013 Cortical age-related cataract, bilateral: Secondary | ICD-10-CM | POA: Diagnosis not present

## 2024-04-12 DIAGNOSIS — H18413 Arcus senilis, bilateral: Secondary | ICD-10-CM | POA: Diagnosis not present

## 2024-04-12 DIAGNOSIS — H2513 Age-related nuclear cataract, bilateral: Secondary | ICD-10-CM | POA: Diagnosis not present

## 2024-05-18 DIAGNOSIS — H2512 Age-related nuclear cataract, left eye: Secondary | ICD-10-CM | POA: Diagnosis not present

## 2024-05-19 DIAGNOSIS — H2511 Age-related nuclear cataract, right eye: Secondary | ICD-10-CM | POA: Diagnosis not present

## 2024-06-08 DIAGNOSIS — H2511 Age-related nuclear cataract, right eye: Secondary | ICD-10-CM | POA: Diagnosis not present

## 2024-08-29 NOTE — Progress Notes (Signed)
 COVID Vaccine received:  []  No [x]  Yes Date of any COVID positive Test in last 90 days: no PCP - Dr. Oneil Neth Cardiologist - n/a  Chest x-ray - Chest CT 11/17/23 Epic EKG -   Stress Test -  ECHO -  Cardiac Cath -   Bowel Prep - [x]  No  []   Yes ______  Pacemaker / ICD device [x]  No []  Yes   Spinal Cord Stimulator:[x]  No []  Yes       History of Sleep Apnea? [x]  No []  Yes   CPAP used?- [x]  No []  Yes    Does the patient monitor blood sugar?          [x]  No []  Yes  []  N/A  Patient has: [x]  NO Hx DM   []  Pre-DM                 []  DM1  []   DM2 Does patient have a [employer] or Dexacom? []  No []  Yes   Fasting Blood Sugar Ranges-  Checks Blood Sugar _____ times a day  GLP1 agonist / usual dose - no GLP1 instructions:  SGLT-2 inhibitors / usual dose - no SGLT-2 instructions:   Blood Thinner / Instructions:no Aspirin Instructions:no  Comments:   Activity level: Patient is able to climb a flight of stairs without difficulty; [x]  No CP  [x]  No SOB,    Patient can  perform ADLs without assistance.   Anesthesia review:   Patient denies shortness of breath, fever, cough and chest pain at PAT appointment.  Patient verbalized understanding and agreement to the Pre-Surgical Instructions that were given to them at this PAT appointment. Patient was also educated of the need to review these PAT instructions again prior to his/her surgery.I reviewed the appropriate phone numbers to call if they have any and questions or concerns.

## 2024-08-29 NOTE — Patient Instructions (Signed)
 SURGICAL WAITING ROOM VISITATION  Patients having surgery or a procedure may have no more than 2 support people in the waiting area - these visitors may rotate.    Children under the age of 16 must have an adult with them who is not the patient.  Visitors with respiratory illnesses are discouraged from visiting and should remain at home.  If the patient needs to stay at the hospital during part of their recovery, the visitor guidelines for inpatient rooms apply. Pre-op nurse will coordinate an appropriate time for 1 support person to accompany patient in pre-op.  This support person may not rotate.    Please refer to the [facility] website for the visitor guidelines for Inpatients (after your surgery is over and you are in a regular room).       Your procedure is scheduled on: 09/08/24   Report to [facility] Main Entrance    Report to admitting at 5:15 AM   Call this number if you have problems the morning of surgery [phone]   Do not eat food :After Midnight.   After Midnight you may have the following liquids until 4:30 AM DAY OF SURGERY  Water  Non-Citrus Juices (without pulp, NO RED-Apple, White grape, White cranberry) Black Coffee (NO MILK/CREAM OR CREAMERS, sugar ok)  Clear Tea (NO MILK/CREAM OR CREAMERS, sugar ok) regular and decaf                             Plain Jell-O (NO RED)                                           Fruit ices (not with fruit pulp, NO RED)                                     Popsicles (NO RED)                                                               Sports drinks like Gatorade (NO RED)             The day of surgery:  Drink ONE (1) Pre-Surgery Clear Ensure at 4:30 AM the morning of surgery. Drink in one sitting. Do not sip.  This drink was given to you during your hospital  pre-op appointment visit. Nothing else to drink after completing the  Pre-Surgery Clear Ensure        Oral Hygiene is also important to reduce your risk  of infection.                                    Remember - BRUSH YOUR TEETH THE MORNING OF SURGERY WITH YOUR REGULAR TOOTHPASTE  DENTURES WILL BE REMOVED PRIOR TO SURGERY PLEASE DO NOT APPLY Poly grip OR ADHESIVES!!!     Stop all vitamins and herbal supplements 7 days before surgery.   Take these medicines the morning of surgery with A SIP OF WATER : Ezetimibe, simvastatin  You may not have any metal on your body including hair pins, jewelry, and body piercing             Do not wear make-up, lotions, powders, perfumes/cologne, or deodorant  Do not wear nail polish including gel and S&S, artificial/acrylic nails, or any other type of covering on natural nails including finger and toenails. If you have artificial nails, gel coating, etc. that needs to be removed by a nail salon please have this removed prior to surgery or surgery may need to be canceled/ delayed if the surgeon/ anesthesia feels like they are unable to be safely monitored.   Do not shave  48 hours prior to surgery.           Do not bring valuables to the hospital. [facility] IS NOT             RESPONSIBLE   FOR VALUABLES.   Contacts, glasses, dentures or bridgework may not be worn into surgery.  DO NOT BRING YOUR HOME MEDICATIONS TO THE HOSPITAL. PHARMACY WILL DISPENSE MEDICATIONS LISTED ON YOUR MEDICATION LIST TO YOU DURING YOUR ADMISSION IN THE HOSPITAL!    Patients discharged on the day of surgery will not be allowed to drive home.  Someone NEEDS to stay with you for the first 24 hours after anesthesia.   Special Instructions: Bring a copy of your healthcare power of attorney and living will documents the day of surgery if you haven't scanned them before.              Please read over the following fact sheets you were given: IF YOU HAVE QUESTIONS ABOUT YOUR PRE-OP INSTRUCTIONS PLEASE CALL [phone] [place]   If you received a COVID test during your pre-op visit  it is requested that you wear a  mask when out in public, stay away from anyone that may not be feeling well and notify your surgeon if you develop symptoms. If you test positive for Covid or have been in contact with anyone that has tested positive in the last 10 days please notify you surgeon.      Pre-operative 4 CHG Bath Instructions  DYNA-Hex 4 Chlorhexidine Gluconate 4% Solution Antiseptic 4 fl. oz   You can play a key role in reducing the risk of infection after surgery. Your skin needs to be as free of germs as possible. You can reduce the number of germs on your skin by washing with CHG (chlorhexidine gluconate) soap before surgery. CHG is an antiseptic soap that kills germs and continues to kill germs even after washing.   DO NOT use if you have an allergy to chlorhexidine/CHG or antibacterial soaps. If your skin becomes reddened or irritated, stop using the CHG and notify one of our RNs at   Please shower with the CHG soap starting 4 days before surgery using the following schedule:     Please keep in mind the following:  DO NOT shave, including legs and underarms, starting the day of your first shower.   You may shave your face at any point before/day of surgery.  Place clean sheets on your bed the day you start using CHG soap. Use a clean washcloth (not used since being washed) for each shower. DO NOT sleep with pets once you start using the CHG.  CHG Shower Instructions:  If you choose to wash your hair and private area, wash first with your normal shampoo/soap.  After you use shampoo/soap, rinse your hair and body thoroughly to  remove shampoo/soap residue.  Turn the water OFF and apply about 3 tablespoons (45 ml) of CHG soap to a CLEAN washcloth.  Apply CHG soap ONLY FROM YOUR NECK DOWN TO YOUR TOES (washing for 3-5 minutes)  DO NOT use CHG soap on face, private areas, open wounds, or sores.  Pay special attention to the area where your surgery is being performed.  If you are having back surgery, having  someone wash your back for you may be helpful. Wait 2 minutes after CHG soap is applied, then you may rinse off the CHG soap.  Pat dry with a clean towel  Put on clean clothes/pajamas   If you choose to wear lotion, please use ONLY the CHG-compatible lotions on the back of this paper.     Additional instructions for the day of surgery: DO NOT APPLY any lotions, deodorants, cologne, or perfumes.   Put on clean/comfortable clothes.  Brush your teeth.  Ask your nurse before applying any prescription medications to the skin.   CHG Compatible Lotions   Aveeno Moisturizing lotion  Cetaphil Moisturizing Cream  Cetaphil Moisturizing Lotion  Clairol Herbal Essence Moisturizing Lotion, Dry Skin  Clairol Herbal Essence Moisturizing Lotion, Extra Dry Skin  Clairol Herbal Essence Moisturizing Lotion, Normal Skin  Curel Age Defying Therapeutic Moisturizing Lotion with Alpha Hydroxy  Curel Extreme Care Body Lotion  Curel Soothing Hands Moisturizing Hand Lotion  Curel Therapeutic Moisturizing Cream, Fragrance-Free  Curel Therapeutic Moisturizing Lotion, Fragrance-Free  Curel Therapeutic Moisturizing Lotion, Original Formula  Eucerin Daily Replenishing Lotion  Eucerin Dry Skin Therapy Plus Alpha Hydroxy Crme  Eucerin Dry Skin Therapy Plus Alpha Hydroxy Lotion  Eucerin Original Crme  Eucerin Original Lotion  Eucerin Plus Crme Eucerin Plus Lotion  Eucerin TriLipid Replenishing Lotion  Keri Anti-Bacterial Hand Lotion  Keri Deep Conditioning Original Lotion Dry Skin Formula Softly Scented  Keri Deep Conditioning Original Lotion, Fragrance Free Sensitive Skin Formula  Keri Lotion Fast Absorbing Fragrance Free Sensitive Skin Formula  Keri Lotion Fast Absorbing Softly Scented Dry Skin Formula  Keri Original Lotion  Keri Skin Renewal Lotion Keri Silky Smooth Lotion  Keri Silky Smooth Sensitive Skin Lotion  Nivea Body Creamy Conditioning Oil  Nivea Body Extra Enriched Lotion  Nivea Body Original  Lotion  Nivea Body Sheer Moisturizing Lotion Nivea Crme  Nivea Skin Firming Lotion  NutraDerm 30 Skin Lotion  NutraDerm Skin Lotion  NutraDerm Therapeutic Skin Cream  NutraDerm Therapeutic Skin Lotion  ProShield Protective Hand Cream  Incentive Spirometer   An incentive spirometer is a tool that can help keep your lungs clear and active. This tool measures how well you are filling your lungs with each breath. Taking long deep breaths may help reverse or decrease the chance of developing breathing (pulmonary) problems (especially infection) following: A long period of time when you are unable to move or be active. BEFORE THE PROCEDURE  If the spirometer includes an indicator to show your best effort, your nurse or respiratory therapist will set it to a desired goal. If possible, sit up straight or lean slightly forward. Try not to slouch. Hold the incentive spirometer in an upright position. INSTRUCTIONS FOR USE  Sit on the edge of your bed if possible, or sit up as far as you can in bed or on a chair. Hold the incentive spirometer in an upright position. Breathe out normally. Place the mouthpiece in your mouth and seal your lips tightly around it. Breathe in slowly and as deeply as possible, raising the piston or  the ball toward the top of the column. Hold your breath for 3-5 seconds or for as long as possible. Allow the piston or ball to fall to the bottom of the column. Remove the mouthpiece from your mouth and breathe out normally. Rest for a few seconds and repeat Steps 1 through 7 at least 10 times every 1-2 hours when you are awake. Take your time and take a few normal breaths between deep breaths. The spirometer may include an indicator to show your best effort. Use the indicator as a goal to work toward during each repetition. After each set of 10 deep breaths, practice coughing to be sure your lungs are clear. If you have an incision (the cut made at the time of surgery),  support your incision when coughing by placing a pillow or rolled up towels firmly against it. Once you are able to get out of bed, walk around indoors and cough well. You may stop using the incentive spirometer when instructed by your caregiver.  RISKS AND COMPLICATIONS Take your time so you do not get dizzy or light-headed. If you are in pain, you may need to take or ask for pain medication before doing incentive spirometry. It is harder to take a deep breath if you are having pain. AFTER USE Rest and breathe slowly and easily. It can be helpful to keep track of a log of your progress. Your caregiver can provide you with a simple table to help with this. If you are using the spirometer at home, follow these instructions: SEEK MEDICAL CARE IF:  You are having difficultly using the spirometer. You have trouble using the spirometer as often as instructed. Your pain medication is not giving enough relief while using the spirometer. You develop fever of 100.5 F (38.1 C) or higher. SEEK IMMEDIATE MEDICAL CARE IF:  You cough up bloody sputum that had not been present before. You develop fever of 102 F (38.9 C) or greater. You develop worsening pain at or near the incision site. MAKE SURE YOU:  Understand these instructions. Will watch your condition. Will get help right away if you are not doing well or get worse.   [facility]- Preparing for Total Shoulder Arthroplasty    Before surgery, you can play an important role. Because skin is not sterile, your skin needs to be as free of germs as possible. You can reduce the number of germs on your skin by using the following products. Benzoyl Peroxide Gel Reduces the number of germs present on the skin Applied twice a day to shoulder area starting two days before surgery    ==================================================================  Please follow these instructions carefully:  BENZOYL PEROXIDE 5% GEL  Please do not use if you  have an allergy to benzoyl peroxide.   If your skin becomes reddened/irritated stop using the benzoyl peroxide.  Starting two days before surgery, apply as follows: Apply benzoyl peroxide in the morning and at night. Apply after taking a shower. If you are not taking a shower clean entire shoulder front, back, and side along with the armpit with a clean wet washcloth.  Place a quarter-sized dollop on your shoulder and rub in thoroughly, making sure to cover the front, back, and side of your shoulder, along with the armpit.   2 days before ____ AM   ____ PM              1 day before ____ AM   ____ PM  Do this twice a day for two days.  (Last application is the night before surgery, AFTER using the CHG soap as described below).  Do NOT apply benzoyl peroxide gel on the day of surgery.

## 2024-08-30 ENCOUNTER — Encounter (HOSPITAL_COMMUNITY): Payer: Self-pay

## 2024-08-30 ENCOUNTER — Other Ambulatory Visit: Payer: Self-pay

## 2024-08-30 ENCOUNTER — Encounter (HOSPITAL_COMMUNITY)
Admission: RE | Admit: 2024-08-30 | Discharge: 2024-08-30 | Disposition: A | Source: Ambulatory Visit | Attending: Anesthesiology

## 2024-08-30 DIAGNOSIS — Z01818 Encounter for other preprocedural examination: Secondary | ICD-10-CM | POA: Diagnosis not present

## 2024-08-30 HISTORY — DX: Personal history of urinary calculi: Z87.442

## 2024-08-30 HISTORY — DX: Unspecified osteoarthritis, unspecified site: M19.90

## 2024-08-30 NOTE — Progress Notes (Signed)
 Reached pt. By phone to do PST interview and pre op instructions. Pt. Identified herself by name and DOB. Pt. Answered all questions. Pre op instructions given. Pt. Will come 08/31/24 for labs, pre admit and to pick up soap, preop drink and Benzoyl peroxide.

## 2024-08-31 ENCOUNTER — Encounter (HOSPITAL_COMMUNITY)
Admission: RE | Admit: 2024-08-31 | Discharge: 2024-08-31 | Disposition: A | Source: Ambulatory Visit | Attending: Orthopedic Surgery | Admitting: Orthopedic Surgery

## 2024-08-31 DIAGNOSIS — Z01818 Encounter for other preprocedural examination: Secondary | ICD-10-CM | POA: Diagnosis not present

## 2024-08-31 LAB — SURGICAL PCR SCREEN
MRSA, PCR: NEGATIVE
Staphylococcus aureus: NEGATIVE

## 2024-08-31 LAB — CBC
HCT: 42.8 % (ref 36.0–46.0)
Hemoglobin: 14.2 g/dL (ref 12.0–15.0)
MCH: 31.6 pg (ref 26.0–34.0)
MCHC: 33.2 g/dL (ref 30.0–36.0)
MCV: 95.3 fL (ref 80.0–100.0)
Platelets: 321 K/uL (ref 150–400)
RBC: 4.49 MIL/uL (ref 3.87–5.11)
RDW: 12.1 % (ref 11.5–15.5)
WBC: 6.7 K/uL (ref 4.0–10.5)
nRBC: 0 % (ref 0.0–0.2)

## 2024-09-07 NOTE — Anesthesia Preprocedure Evaluation (Addendum)
 Anesthesia Evaluation  Patient identified by MRN, date of birth, ID band Patient awake    Reviewed: Allergy & Precautions, H&P , NPO status , Patient's Chart, lab work & pertinent test results  History of Anesthesia Complications (+) PONV and history of anesthetic complications  Airway Mallampati: II  TM Distance: >3 FB Neck ROM: Full    Dental no notable dental hx. (+) Teeth Intact, Dental Advisory Given   Pulmonary neg pulmonary ROS, former smoker   Pulmonary exam normal breath sounds clear to auscultation       Cardiovascular Exercise Tolerance: Good negative cardio ROS  Rhythm:Regular Rate:Normal     Neuro/Psych negative neurological ROS  negative psych ROS   GI/Hepatic negative GI ROS, Neg liver ROS,,,  Endo/Other  negative endocrine ROS    Renal/GU negative Renal ROS  negative genitourinary   Musculoskeletal  (+) Arthritis , Osteoarthritis,    Abdominal   Peds  Hematology negative hematology ROS (+)   Anesthesia Other Findings   Reproductive/Obstetrics negative OB ROS                              Anesthesia Physical Anesthesia Plan  ASA: 2  Anesthesia Plan: General   Post-op Pain Management: Regional block* and Tylenol  PO (pre-op)*   Induction: Intravenous  PONV Risk Score and Plan: 4 or greater and Ondansetron , Dexamethasone  and Treatment may vary due to age or medical condition  Airway Management Planned: Oral ETT  Additional Equipment:   Intra-op Plan:   Post-operative Plan: Extubation in OR  Informed Consent: I have reviewed the patients History and Physical, chart, labs and discussed the procedure including the risks, benefits and alternatives for the proposed anesthesia with the patient or authorized representative who has indicated his/her understanding and acceptance.     Dental advisory given  Plan Discussed with: CRNA  Anesthesia Plan Comments:           Anesthesia Quick Evaluation

## 2024-09-08 ENCOUNTER — Encounter (HOSPITAL_COMMUNITY): Payer: Self-pay | Admitting: Medical

## 2024-09-08 ENCOUNTER — Encounter (HOSPITAL_COMMUNITY): Payer: Self-pay | Admitting: Orthopedic Surgery

## 2024-09-08 ENCOUNTER — Encounter (HOSPITAL_COMMUNITY): Admission: RE | Disposition: A | Payer: Self-pay | Source: Ambulatory Visit | Attending: Orthopedic Surgery

## 2024-09-08 ENCOUNTER — Ambulatory Visit (HOSPITAL_COMMUNITY): Admitting: Anesthesiology

## 2024-09-08 ENCOUNTER — Other Ambulatory Visit: Payer: Self-pay

## 2024-09-08 ENCOUNTER — Ambulatory Visit (HOSPITAL_COMMUNITY)
Admission: RE | Admit: 2024-09-08 | Discharge: 2024-09-08 | Disposition: A | Attending: Orthopedic Surgery | Admitting: Orthopedic Surgery

## 2024-09-08 DIAGNOSIS — M75101 Unspecified rotator cuff tear or rupture of right shoulder, not specified as traumatic: Secondary | ICD-10-CM | POA: Diagnosis present

## 2024-09-08 DIAGNOSIS — M19011 Primary osteoarthritis, right shoulder: Secondary | ICD-10-CM | POA: Diagnosis not present

## 2024-09-08 DIAGNOSIS — Z87891 Personal history of nicotine dependence: Secondary | ICD-10-CM | POA: Insufficient documentation

## 2024-09-08 DIAGNOSIS — G8929 Other chronic pain: Secondary | ICD-10-CM | POA: Diagnosis present

## 2024-09-08 HISTORY — PX: REVERSE SHOULDER ARTHROPLASTY: SHX5054

## 2024-09-08 SURGERY — ARTHROPLASTY, SHOULDER, TOTAL, REVERSE
Anesthesia: General | Site: Shoulder | Laterality: Right

## 2024-09-08 MED ORDER — 0.9 % SODIUM CHLORIDE (POUR BTL) OPTIME
TOPICAL | Status: DC | PRN
  Administered 2024-09-08: 08:00:00 1000 mL

## 2024-09-08 MED ORDER — MORPHINE SULFATE (PF) 2 MG/ML IV SOLN: 1.0000 mg | INTRAVENOUS | Status: DC | PRN

## 2024-09-08 MED ORDER — PHENYLEPHRINE HCL (PRESSORS) 10 MG/ML IV SOLN
INTRAVENOUS | Status: DC | PRN
  Administered 2024-09-08: 09:00:00 80 ug via INTRAVENOUS

## 2024-09-08 MED ORDER — VANCOMYCIN HCL 1000 MG IV SOLR
INTRAVENOUS | Status: DC | PRN
  Administered 2024-09-08: 08:00:00 1000 mg via TOPICAL

## 2024-09-08 MED ORDER — FENTANYL CITRATE (PF) 100 MCG/2ML IJ SOLN
INTRAMUSCULAR | Status: DC | PRN
  Administered 2024-09-08: 08:00:00 50 ug via INTRAVENOUS

## 2024-09-08 MED ORDER — PROPOFOL 10 MG/ML IV BOLUS
INTRAVENOUS | Status: AC
  Filled 2024-09-08: qty 20

## 2024-09-08 MED ORDER — STERILE WATER FOR IRRIGATION IR SOLN
Status: DC | PRN
  Administered 2024-09-08: 08:00:00 2000 mL

## 2024-09-08 MED ORDER — LACTATED RINGERS IV SOLN: INTRAVENOUS | Status: DC | PRN

## 2024-09-08 MED ORDER — FENTANYL CITRATE (PF) 100 MCG/2ML IJ SOLN
INTRAMUSCULAR | Status: AC
  Filled 2024-09-08: qty 2

## 2024-09-08 MED ORDER — ONDANSETRON HCL 4 MG PO TABS: 4.0000 mg | ORAL_TABLET | Freq: Three times a day (TID) | ORAL | 0 refills | Status: AC | PRN

## 2024-09-08 MED ORDER — VANCOMYCIN HCL 1000 MG IV SOLR
INTRAVENOUS | Status: AC
  Filled 2024-09-08: qty 20

## 2024-09-08 MED ORDER — ROCURONIUM BROMIDE 100 MG/10ML IV SOLN
INTRAVENOUS | Status: DC | PRN
  Administered 2024-09-08: 08:00:00 50 mg via INTRAVENOUS

## 2024-09-08 MED ORDER — ACETAMINOPHEN 500 MG PO TABS
1000.0000 mg | ORAL_TABLET | Freq: Once | ORAL | Status: AC
  Administered 2024-09-08: 06:00:00 1000 mg via ORAL
  Filled 2024-09-08: qty 2

## 2024-09-08 MED ORDER — PHENYLEPHRINE HCL-NACL 20-0.9 MG/250ML-% IV SOLN
INTRAVENOUS | Status: DC | PRN
  Administered 2024-09-08: 08:00:00 25 ug/min via INTRAVENOUS

## 2024-09-08 MED ORDER — CHLORHEXIDINE GLUCONATE 0.12 % MT SOLN
15.0000 mL | Freq: Once | OROMUCOSAL | Status: AC
  Administered 2024-09-08: 06:00:00 15 mL via OROMUCOSAL

## 2024-09-08 MED ORDER — PROPOFOL 10 MG/ML IV BOLUS
INTRAVENOUS | Status: DC | PRN
  Administered 2024-09-08: 08:00:00 200 mg via INTRAVENOUS

## 2024-09-08 MED ORDER — LIDOCAINE HCL (CARDIAC) PF 100 MG/5ML IV SOSY
PREFILLED_SYRINGE | INTRAVENOUS | Status: DC | PRN
  Administered 2024-09-08: 08:00:00 40 mg via INTRAVENOUS

## 2024-09-08 MED ORDER — PHENYLEPHRINE HCL (PRESSORS) 10 MG/ML IV SOLN
INTRAVENOUS | Status: AC
  Filled 2024-09-08: qty 1

## 2024-09-08 MED ORDER — ORAL CARE MOUTH RINSE: 15.0000 mL | Freq: Once | OROMUCOSAL | Status: AC

## 2024-09-08 MED ORDER — CELECOXIB 200 MG PO CAPS: 200.0000 mg | ORAL_CAPSULE | Freq: Every day | ORAL | 1 refills | Status: AC | PRN

## 2024-09-08 MED ORDER — BUPIVACAINE LIPOSOME 1.3 % IJ SUSP
INTRAMUSCULAR | Status: DC | PRN
  Administered 2024-09-08: 07:00:00 10 mL via PERINEURAL

## 2024-09-08 MED ORDER — HYDROMORPHONE HCL 2 MG PO TABS: 2.0000 mg | ORAL_TABLET | ORAL | 0 refills | Status: AC | PRN

## 2024-09-08 MED ORDER — CYCLOBENZAPRINE HCL 10 MG PO TABS: 10.0000 mg | ORAL_TABLET | Freq: Three times a day (TID) | ORAL | 1 refills | Status: AC | PRN

## 2024-09-08 MED ORDER — TRANEXAMIC ACID-NACL 1000-0.7 MG/100ML-% IV SOLN
1000.0000 mg | INTRAVENOUS | Status: AC
  Administered 2024-09-08: 08:00:00 1000 mg via INTRAVENOUS
  Filled 2024-09-08: qty 100

## 2024-09-08 MED ORDER — CEFAZOLIN SODIUM-DEXTROSE 2-4 GM/100ML-% IV SOLN
2.0000 g | INTRAVENOUS | Status: AC
  Administered 2024-09-08: 08:00:00 2 g via INTRAVENOUS
  Filled 2024-09-08: qty 100

## 2024-09-08 MED ORDER — BUPIVACAINE-EPINEPHRINE (PF) 0.5% -1:200000 IJ SOLN
INTRAMUSCULAR | Status: DC | PRN
  Administered 2024-09-08: 07:00:00 15 mL via PERINEURAL

## 2024-09-08 MED ORDER — ONDANSETRON HCL 4 MG/2ML IJ SOLN
INTRAMUSCULAR | Status: DC | PRN
  Administered 2024-09-08: 08:00:00 4 mg via INTRAVENOUS

## 2024-09-08 MED ORDER — SUGAMMADEX SODIUM 200 MG/2ML IV SOLN
INTRAVENOUS | Status: DC | PRN
  Administered 2024-09-08: 09:00:00 200 mg via INTRAVENOUS

## 2024-09-08 MED ORDER — LACTATED RINGERS IV SOLN: INTRAVENOUS | Status: DC

## 2024-09-08 MED ORDER — TRAMADOL HCL 50 MG PO TABS: 50.0000 mg | ORAL_TABLET | Freq: Four times a day (QID) | ORAL | 0 refills | Status: AC | PRN

## 2024-09-08 MED ADMIN — Dexamethasone Sod Phosphate Preservative Free Inj 10 MG/ML: 5 mg | INTRAVENOUS | @ 08:00:00 | NDC 55150030401

## 2024-09-08 SURGICAL SUPPLY — 56 items
BAG COUNTER SPONGE SURGICOUNT (BAG) IMPLANT
BAG ZIPLOCK 12X15 (MISCELLANEOUS) ×1 IMPLANT
BLADE SAW SGTL 83.5X18.5 (BLADE) ×1 IMPLANT
BNDG COHESIVE 4X5 TAN STRL LF (GAUZE/BANDAGES/DRESSINGS) ×1 IMPLANT
COOLER ICEMAN CLASSIC (MISCELLANEOUS) ×1 IMPLANT
COVER BACK TABLE 60X90IN (DRAPES) ×1 IMPLANT
COVER SURGICAL LIGHT HANDLE (MISCELLANEOUS) ×1 IMPLANT
CUP SUT UNIV REV NEUTRAL 33 (Cup) IMPLANT
DRAPE SHEET LG 3/4 BI-LAMINATE (DRAPES) ×1 IMPLANT
DRAPE SURG 17X11 SM STRL (DRAPES) ×1 IMPLANT
DRAPE SURG ORHT 6 SPLT 77X108 (DRAPES) ×2 IMPLANT
DRAPE TOP 10253 STERILE (DRAPES) ×1 IMPLANT
DRAPE U-SHAPE 47X51 STRL (DRAPES) ×1 IMPLANT
DRESSING AQUACEL AG SP 3.5X6 (GAUZE/BANDAGES/DRESSINGS) ×1 IMPLANT
DURAPREP 26ML APPLICATOR (WOUND CARE) ×1 IMPLANT
ELECT BLADE TIP CTD 4 INCH (ELECTRODE) ×1 IMPLANT
ELECT PENCIL ROCKER SW 15FT (MISCELLANEOUS) ×1 IMPLANT
ELECT REM PT RETURN 15FT ADLT (MISCELLANEOUS) ×1 IMPLANT
FACESHIELD WRAPAROUND OR TEAM (MASK) ×5 IMPLANT
GLENOID UNI REV MOD 24 +2 LAT (Joint) IMPLANT
GLENOSPHERE 33+4 LAT/24 UNI RV (Joint) IMPLANT
GLOVE BIO SURGEON STRL SZ7.5 (GLOVE) ×1 IMPLANT
GLOVE BIO SURGEON STRL SZ8 (GLOVE) ×1 IMPLANT
GLOVE SS BIOGEL STRL SZ 7 (GLOVE) ×1 IMPLANT
GLOVE SS BIOGEL STRL SZ 7.5 (GLOVE) ×1 IMPLANT
GOWN SPEC L4 XLG W/TWL (GOWN DISPOSABLE) ×1 IMPLANT
GOWN STRL SURGICAL XL XLNG (GOWN DISPOSABLE) ×1 IMPLANT
KIT BASIN OR (CUSTOM PROCEDURE TRAY) ×1 IMPLANT
KIT TURNOVER KIT A (KITS) ×1 IMPLANT
LINER GLENOID HUM REV 33 +3 (Liner) IMPLANT
MANIFOLD NEPTUNE II (INSTRUMENTS) ×1 IMPLANT
NDL TAPERED W/ NITINOL LOOP (MISCELLANEOUS) ×1 IMPLANT
NEEDLE TAPERED W/ NITINOL LOOP (MISCELLANEOUS) ×1 IMPLANT
NS IRRIG 1000ML POUR BTL (IV SOLUTION) ×1 IMPLANT
PACK SHOULDER (CUSTOM PROCEDURE TRAY) ×1 IMPLANT
PAD ARMBOARD POSITIONER FOAM (MISCELLANEOUS) ×1 IMPLANT
PAD COLD SHLDR WRAP-ON (PAD) ×1 IMPLANT
PIN NITINOL TARGETER 2.8 (PIN) IMPLANT
PIN SET MODULAR GLENOID SYSTEM (PIN) IMPLANT
RESTRAINT HEAD UNIVERSAL NS (MISCELLANEOUS) ×1 IMPLANT
SCREW CENTRAL MODULAR 25 (Screw) IMPLANT
SCREW PERI LOCK 5.5X36 (Screw) IMPLANT
SCREW PERIPHERAL 5.5X20 LOCK (Screw) IMPLANT
SLING ARM FOAM STRAP LRG (SOFTGOODS) IMPLANT
SLING ARM FOAM STRAP MED (SOFTGOODS) IMPLANT
STEM HUM UNIV REV APEX SZ 6 (Stem) IMPLANT
STRIP CLOSURE SKIN 1/2X4 (GAUZE/BANDAGES/DRESSINGS) ×1 IMPLANT
SUT MNCRL AB 3-0 PS2 18 (SUTURE) ×1 IMPLANT
SUT MON AB 2-0 CT1 36 (SUTURE) ×1 IMPLANT
SUT VIC AB 1 CT1 36 (SUTURE) ×1 IMPLANT
SUTURE TAPE 1.3 40 TPR END (SUTURE) ×2 IMPLANT
TOWEL GREEN STERILE FF (TOWEL DISPOSABLE) ×1 IMPLANT
TOWEL OR DSP ST BLU DLX 10/PK (DISPOSABLE) ×1 IMPLANT
TUBE SUCTION HIGH CAP CLEAR NV (SUCTIONS) ×1 IMPLANT
TUBING CONNECTING 10 (TUBING) ×1 IMPLANT
WATER STERILE IRR 1000ML POUR (IV SOLUTION) ×2 IMPLANT

## 2024-09-08 NOTE — Evaluation (Signed)
 Occupational Therapy Evaluation Patient Details Name: Rachel Barker MRN: [id number] DOB: 04-Dec-1945 Today's Date: 09/08/2024   History of Present Illness   Patient is a 78 year old female who has been followed for chronic and progressively increasing right shoulder pain related to glenohumeral arthritis and chronic rotator cuff tear.  Due to her increasing functional limitations and failure to respond to prolonged attempts at conservative management. Referred to OT s/p planned right shoulder reverse arthroplasty. PMH: OA, kidney stones, hypercholesteremia, PONV, BP and HR drops with anesthesia     Clinical Impressions PTA pt lives alone and was independent with all aspects of A/BADL's and mobility including driving. Son Jama present for education and will be with patient at home.  Education completed regarding compensatory strategies for ADL tasks and functional mobility, management of sling, R ROM per specified parameters in the order set as indicated below, positioning of operative arm in sitting and supine and edema control, including use of Iceman Cold Therapy machine. Caregiver present for education, written handouts provided and reviewed using Teach Back and pt/caregiver verbalized/demonstrated understanding. Due to the below listed deficits, pt requires min A assistance with ADL tasks and mod I (sling) on level surfaces with functional mobility. Caregiver will be able to provide necessary level of assistance at discharge. Pt to follow up with MD to progress rehab of the operative shoulder.      If plan is discharge home, recommend the following:   A little help with walking and/or transfers;A little help with bathing/dressing/bathroom;Assistance with cooking/housework;Help with stairs or ramp for entrance;Assist for transportation     Functional Status Assessment   Patient has had a recent decline in their functional status and demonstrates the ability to make significant  improvements in function in a reasonable and predictable amount of time.     Equipment Recommendations   None recommended by OT      Precautions/Restrictions   Precautions Precautions: Shoulder Precaution Booklet Issued: Yes (comment) Required Braces or Orthoses: Sling Restrictions Weight Bearing Restrictions Per Provider Order: Yes RUE Weight Bearing Per Provider Order: Non weight bearing Shoulder FF 0-60; ER 0-20; Abd 0-45. ROM for ADL purposes only; AROM elbow/wrist/hand; OK for lap slides and pendulums; Loosen sling from neck when arm is supported in sitting, no internal rotation or resistive internal rotation allowed      Mobility Bed Mobility Overal bed mobility:  (educated on bed mobility but remained in chair)                  Transfers Overall transfer level: Modified independent                        Balance Overall balance assessment: No apparent balance deficits (not formally assessed)                                         ADL either performed or assessed with clinical judgement   ADL Overall ADL's : Needs assistance/impaired    Per orders, R shoulder parameters as follows for ADL tasks: Abd 0-45; ER 0-20; FF 0-60; While moving within specified parameters, pt/caregiver instructed on bathing and how to donn/doff shirt, placing operative arm through sleeve first when donning and off last when doffing.Pt/caregiver educated on compensatory strategies for LB ADL and strategies to reduce risk of falls.  Pt/caregiver educated on donning/doffing sling and to wear the sling at all  times with the exception of ADL, and to loosen the neck strap of the sling when the operative arm is in a supported position when sitting. In sitting or supine, pt instructed to have a pillow behind and under their operative arm to provide support. If assist needed with ambulation, caregiver educated on the importance of walking on pt's non-operative side.   Education regarding use of IceMan Cold Therapy completed, including the importance of using a barrier on the shoulder prior to positioning the wrap-on pad. Pt/caregiver verbalized/demonstrated understanding. Teach Back used while caregiver assisted with dressing pt and positioning wrap-on pad to facilitate DC.                                          Vision Baseline Vision/History: 0 No visual deficits              Pertinent Vitals/Pain Pain Assessment Pain Assessment: No/denies pain (R operative block remains active and iceman in place)     Extremity/Trunk Assessment Upper Extremity Assessment Upper Extremity Assessment: Right hand dominant;RUE deficits/detail RUE Deficits / Details: post op R shoulder, nerve block remains active, AAROM elbow, wrist, hand WFL RUE Coordination: decreased fine motor;decreased gross motor   Lower Extremity Assessment Lower Extremity Assessment: Overall WFL for tasks assessed   Cervical / Trunk Assessment Cervical / Trunk Assessment: Normal   Communication Communication Communication: No apparent difficulties   Cognition Arousal: Alert Behavior During Therapy: WFL for tasks assessed/performed Cognition: No apparent impairments                               Following commands: Intact       Cueing  General Comments   Cueing Techniques: Verbal cues  post op R shoulder dressing intact, SpO2 96% on RA   Exercises Exercises: Shoulder Shoulder Exercises Pendulum Exercise: Right, 10 reps Elbow Flexion: AAROM, Right, 10 reps Elbow Extension: AAROM, Right, 10 reps Wrist Flexion: AAROM, Right, 10 reps Wrist Extension: AAROM, Right, 10 reps Digit Composite Flexion: AAROM, Right, 10 reps Composite Extension: AAROM, Right, 10 reps   Shoulder Instructions Shoulder Instructions Donning/doffing shirt without moving shoulder: Set-up;Caregiver independent with task Method for sponge bathing under operated UE:  Set-up;Caregiver independent with task Donning/doffing sling/immobilizer: Set-up;Caregiver independent with task Correct positioning of sling/immobilizer: Set-up;Caregiver independent with task Pendulum exercises (written home exercise program): Caregiver independent with task;Patient able to independently direct caregiver ROM for elbow, wrist and digits of operated UE: Caregiver independent with task;Patient able to independently direct caregiver Sling wearing schedule (on at all times/off for ADL's): Caregiver independent with task Proper positioning of operated UE when showering: Caregiver independent with task;Patient able to independently direct caregiver Positioning of UE while sleeping: Caregiver independent with task;Patient able to independently direct caregiver    Home Living Family/patient expects to be discharged to:: Private residence Living Arrangements: Alone Available Help at Discharge: Family Type of Home: House Home Access: Stairs to enter Secretary/administrator of Steps: 2   Home Layout: One level     Bathroom Shower/Tub: Producer, Television/film/video: Standard Bathroom Accessibility: Yes How Accessible: Accessible via walker Home Equipment: None   Additional Comments: educated on obtaining reacher and shower chair if needed      Prior Functioning/Environment Prior Level of Function : Independent/Modified Independent;Driving  OT Problem List: Other (comment) (R UE dysfunction)    AM-PAC OT 6 Clicks Daily Activity     Outcome Measure Help from another person eating meals?: None Help from another person taking care of personal grooming?: A Little Help from another person toileting, which includes using toliet, bedpan, or urinal?: A Little Help from another person bathing (including washing, rinsing, drying)?: A Little Help from another person to put on and taking off regular upper body clothing?: A Little Help from another  person to put on and taking off regular lower body clothing?: A Little 6 Click Score: 19   End of Session Equipment Utilized During Treatment: Gait belt;Other (comment) (sling and Iceman) Nurse Communication: Mobility status;Precautions;Weight bearing status;Other (comment) (voided in bathrom=om)  Activity Tolerance: Patient tolerated treatment well Patient left: in chair;with call bell/phone within reach;with nursing/sitter in room;with family/visitor present  OT Visit Diagnosis:  (R UE dysfunction)                Time: 1000-1040 OT Time Calculation (min): 40 min Charges:  OT General Charges $OT Visit: 1 Visit OT Evaluation $OT Eval Low Complexity: 1 Low OT Treatments $Self Care/Home Management : 8-22 mins $Therapeutic Exercise: 8-22 mins  Cavon Nicolls OT/L Acute Rehabilitation Department  [phone]  09/08/2024, 11:45 AM

## 2024-09-08 NOTE — Anesthesia Postprocedure Evaluation (Signed)
 Anesthesia Post Note  Patient: Rachel Barker  Procedure(s) Performed: ARTHROPLASTY, SHOULDER, TOTAL, REVERSE (Right: Shoulder)     Patient location during evaluation: PACU Anesthesia Type: General and Regional Level of consciousness: awake and alert Pain management: pain level controlled Vital Signs Assessment: post-procedure vital signs reviewed and stable Respiratory status: spontaneous breathing, nonlabored ventilation and respiratory function stable Cardiovascular status: blood pressure returned to baseline and stable Postop Assessment: no apparent nausea or vomiting Anesthetic complications: no   No notable events documented.  Last Vitals:  Vitals:   09/08/24 0945 09/08/24 0957  BP: 126/81 (!) 151/80  Pulse: 79 86  Resp: 12 20  Temp: 36.5 C 36.5 C  SpO2: 92% 92%    Last Pain:  Vitals:   09/08/24 0957  TempSrc: Temporal  PainSc: 0-No pain                 Lacy Sofia,W. EDMOND

## 2024-09-08 NOTE — Anesthesia Procedure Notes (Addendum)
 Procedure Name: Intubation Date/Time: 09/08/2024 7:43 AM  Performed by: Dartha Meckel, CRNAPre-anesthesia Checklist: Patient identified, Emergency Drugs available, Suction available and Patient being monitored Patient Re-evaluated:Patient Re-evaluated prior to induction Oxygen Delivery Method: Circle system utilized Preoxygenation: Pre-oxygenation with 100% oxygen Induction Type: IV induction Ventilation: Mask ventilation without difficulty Laryngoscope Size: Mac and 3 Grade View: Grade II Tube type: Oral Tube size: 7.0 mm Number of attempts: 1 Airway Equipment and Method: Stylet and Oral airway Placement Confirmation: ETT inserted through vocal cords under direct vision, positive ETCO2 and breath sounds checked- equal and bilateral Secured at: 21 cm Tube secured with: Tape Dental Injury: Teeth and Oropharynx as per pre-operative assessment

## 2024-09-08 NOTE — Care Plan (Signed)
 Ortho Bundle Case Management Note  Patient Details  Name: Rachel Barker MRN: [id number] Date of Birth: [birth year]  R Rev TSA on 09/08/24  DCP: Home with son and neighbor  DME: No needs  PT: EO                   DME Arranged:  N/A DME Agency:  NA  HH Arranged:    HH Agency:     Additional Comments: Please contact me with any questions of if this plan should need to change.  Lyle Pepper, CCM [facility] [phone]  Ext. [phone]   09/08/2024, 7:45 AM

## 2024-09-08 NOTE — Discharge Instructions (Signed)

## 2024-09-08 NOTE — Transfer of Care (Signed)
 Immediate Anesthesia Transfer of Care Note  Patient: Rachel Barker  Procedure(s) Performed: ARTHROPLASTY, SHOULDER, TOTAL, REVERSE (Right: Shoulder)  Patient Location: PACU  Anesthesia Type:GA combined with regional for post-op pain  Level of Consciousness: awake and alert   Airway & Oxygen Therapy: Patient Spontanous Breathing and Patient connected to face mask oxygen  Post-op Assessment: Report given to RN and Post -op Vital signs reviewed and stable  Post vital signs: Reviewed and stable  Last Vitals:  Vitals Value Taken Time  BP 144/69 09/08/24 09:15  Temp    Pulse 75 09/08/24 09:16  Resp 23 09/08/24 09:16  SpO2 99 % 09/08/24 09:16  Vitals shown include unfiled device data.  Last Pain:  Vitals:   09/08/24 0555  TempSrc:   PainSc: 0-No pain         Complications: No notable events documented.

## 2024-09-08 NOTE — Op Note (Signed)
 09/08/2024  8:55 AM  PATIENT:   Rachel Barker  78 y.o. female  PRE-OPERATIVE DIAGNOSIS:  Right shoulder osteoarthritis with chronic rotator cuff tear  POST-OPERATIVE DIAGNOSIS: Same  PROCEDURE: Right shoulder reverse arthroplasty utilizing a press-fit size 6 short stem humeral implant from [employer] with a neutral metathesis, +3 polyethylene insert, 33/+4 glenosphere and a small/+2 baseplate  SURGEON:  Kijuana Ruppel, Franky BATTLE M.D.  ASSISTANTS: Randine Ricks, PA-C  Randine Ricks, PA-C was utilized as an geophysicist/field seismologist throughout this case, essential for help with positioning the patient, positioning extremity, tissue manipulation, implantation of the prosthesis, suture management, wound closure, and intraoperative decision-making.  ANESTHESIA:   General Endotracheal and interscalene block with Exparel   EBL: 150 cc  SPECIMEN: None  Drains: None   PATIENT DISPOSITION:  PACU - hemodynamically stable.    PLAN OF CARE: Discharge to home after PACU  Brief history:  Patient is a 78 year old female who has been followed for chronic and progressively increasing right shoulder pain related to glenohumeral arthritis and chronic rotator cuff tear.  Due to her increasing functional limitations and failure to respond to prolonged attempts at conservative management, she is brought to the operating room today for planned right shoulder reverse arthroplasty.  Preoperatively, I counseled the patient regarding treatment options and risks versus benefits thereof.  Possible surgical complications were all reviewed including potential for bleeding, infection, neurovascular injury, persistent pain, loss of motion, anesthetic complication, failure of the implant, and possible need for additional surgery. They understand and accept and agrees with our planned procedure.   Procedure in detail:  After undergoing routine preop evaluation the patient received prophylactic antibiotics and interscalene block with  Exparel  was established in the holding area by the anesthesia department.  Subsequently placed spine on the operating table and underwent the smooth induction of a general endotracheal anesthesia.  Placed into the beachchair position and appropriately padded and protected.  The right shoulder girdle region was sterilely prepped and draped in standard fashion.  Timeout was called.  A deltopectoral approach to the right shoulder is made an approximately 6 cm incision.  Skin flaps elevated dissection carried deeply the deltopectoral interval was then developed from proximal to this with the vein taken laterally.  The conjoined tendon was mobilized and retracted medially and adhesions were divided beneath the deltoid.  The long head biceps tendon was then tenodesed at the upper border the pectoralis major tendon with the proximal segment unroofed and excised.  The rotator cuff remnant superiorly was split from the apex of the bicipital groove to the base of the coracoid and the subscapularis was then separated from the lesser tuberosity using electrocautery and the free margin was tagged with a pair of grasping suture tape sutures.  Capsular attachments were then divided from the anterior and inferior margins of the humeral neck and humeral head was then delivered through the wound.  An extra medullary guide was then used to outline the proposed humeral head resection which we performed with an oscillating saw at approximately 20 degrees of retroversion.  Marginal osteophytes were removed with a rondure and a metal Placed with the cut proximal humeral surface.  The glenoid was then exposed and a circumferential labral resection was performed.  A guidepin was then directed into the center of the glenoid and the glenoid was reamed with the central followed by the peripheral reamer to a stable subchondral bony bed in preparation completed with a drill and tapped for a 25 mm lag screw.  Our baseplate was  then assembled and  inserted with vancomycin  powder applied to the threads of the lag screw and excellent fixation was achieved.  The peripheral locking screws were then placed using standard technique with excellent fixation.  A 33/+4 glenosphere was then impacted onto the baseplate and the central locking screw was placed.  Our attention was then returned to the proximal humerus where the canal was opened by hand reaming and we broached up to a size 6 short stem implant at 20 degrees of retroversion.  A neutral metaphyseal reaming guide was then used.  The metaphysis.  A trial implant was then placed and trial reduction showed good motion stability and soft tissue balance.  This point the trial was removed.  The canal was irrigated cleaned and dried with vancomycin  powder applied in the canal.  A final implant was assembled and impacted with excellent fixation.  Trial reductions were then performed and ultimately felt that a +3 poly gives the best motion stability and soft tissue balance.  The trial was then removed and the final poly was impacted.  A final reduction showed X motion stability and soft tissue balance all much to our satisfaction.  The wound was copiously irrigated.  The subscapularis was mobilized and found to have good elasticity and was repaired back to the eyelets on the collar of the implant.  Balance of vancomycin  powder was then sprayed liberally throughout the deep soft tissue planes.  The deltopectoral interval was reapproximated with a series of figure-of-eight number Vicryl sutures.  2-0 Monocryl used to close the subcu layer and intracuticular 3-0 Monocryl used to close the skin followed by Steri-Strips and an Aquacel dressing.  The right arm was placed into a sling.  The patient was awakened, extubated, and taken to the recovery in stable condition.  Franky CHRISTELLA Pointer MD   Contact # [phone]

## 2024-09-08 NOTE — Anesthesia Procedure Notes (Signed)
 Anesthesia Regional Block: Interscalene brachial plexus block   Pre-Anesthetic Checklist: , timeout performed,  Correct Patient, Correct Site, Correct Laterality,  Correct Procedure, Correct Position, site marked,  Risks and benefits discussed,  Pre-op evaluation,  At surgeon's request and post-op pain management  Laterality: Right  Prep: Maximum Sterile Barrier Precautions used, chloraprep       Needles:  Injection technique: Single-shot  Needle Type: Echogenic Stimulator Needle     Needle Length: 5cm  Needle Gauge: 22     Additional Needles:   Procedures:, nerve stimulator,,, ultrasound used (permanent image in chart),,     Nerve Stimulator or Paresthesia:  Response: Biceps response  Additional Responses:   Narrative:  Start time: 09/08/2024 7:00 AM End time: 09/08/2024 7:10 AM Injection made incrementally with aspirations every 5 mL.  Performed by: Personally  Anesthesiologist: Epifanio Fallow, MD

## 2024-09-08 NOTE — H&P (Signed)
 Rachel Barker    Chief Complaint: Right shoulder osteoarthritis with rotator cuff tear HPI: The patient is a 79 y.o. female With chronic and progressively increasing right shoulder pain related to severe rotator cuff tear arthropathy.  Due to her increasing functional limitations and failure to respond to prolonged attempts at conservative management, she is brought to the operating room today for planned right shoulder reverse arthroplasty.  Past Medical History:  Diagnosis Date   Arthritis    History of kidney stones    Hypercholesteremia    PONV (postoperative nausea and vomiting)    Blood pressure and heart rate drops with anesthesia   Wears glasses       Past Surgical History:  Procedure Laterality Date   ABDOMINAL HYSTERECTOMY     BIOPSY  09/06/2020   Procedure: BIOPSY;  Surgeon: Saintclair Jasper, MD;  Location: [facility];  Service: Gastroenterology;;   BIOPSY  01/21/2022   Procedure: BIOPSY;  Surgeon: Saintclair Jasper, MD;  Location: [facility];  Service: Gastroenterology;;   BRAIN SURGERY     COLONOSCOPY N/A 01/21/2022   Procedure: COLONOSCOPY;  Surgeon: Saintclair Jasper, MD;  Location: [facility];  Service: Gastroenterology;  Laterality: N/A;   COLONOSCOPY WITH PROPOFOL  N/A 09/06/2020   Procedure: COLONOSCOPY WITH PROPOFOL ;  Surgeon: Saintclair Jasper, MD;  Location: [facility];  Service: Gastroenterology;  Laterality: N/A;   HEMOSTASIS CLIP PLACEMENT  09/06/2020   Procedure: HEMOSTASIS CLIP PLACEMENT;  Surgeon: Saintclair Jasper, MD;  Location: [facility];  Service: Gastroenterology;;   POLYPECTOMY  09/06/2020   Procedure: POLYPECTOMY;  Surgeon: Saintclair Jasper, MD;  Location: [facility];  Service: Gastroenterology;;    Family History  Problem Relation Age of Onset   Hyperlipidemia Mother    Hyperlipidemia Father     Social History:  reports that she has quit smoking. She has never used smokeless tobacco. She reports current alcohol  use of about 1.0 standard drink of alcohol  per week.  She reports that she does not use drugs.  BMI: Estimated body mass index is 27.21 kg/m as calculated from the following:   Height as of 08/31/24: 5' 4 (1.626 m).   Weight as of 08/31/24: 71.9 kg.  Lab Results  Component Value Date   ALBUMIN 4.0 10/19/2015   Diabetes: Patient does not have a diagnosis of diabetes.     Smoking Status:       Medications Prior to Admission  Medication Sig Dispense Refill   celecoxib  (CELEBREX ) 200 MG capsule Take 200 mg by mouth daily as needed for mild pain (pain score 1-3) (Per pt; might take twice a week).     cholecalciferol (VITAMIN D) 25 MCG (1000 UNIT) tablet Take 1,000 Units by mouth at bedtime.     Coenzyme Q10 100 MG TABS Take 100 mg by mouth at bedtime.     diphenhydrAMINE (BENADRYL) 25 MG tablet Take 25 mg by mouth daily as needed for allergies.     ezetimibe (ZETIA) 10 MG tablet Take 10 mg by mouth at bedtime.     ibuprofen (ADVIL) 200 MG tablet Take 200 mg by mouth daily as needed for mild pain (pain score 1-3) or moderate pain (pain score 4-6).     simvastatin (ZOCOR) 40 MG tablet Take 40 mg by mouth at bedtime.     vitamin B-12 (CYANOCOBALAMIN) 1000 MCG tablet Take 1,000 mcg by mouth at bedtime.       Physical Exam: Right shoulder demonstrates painful and guarded motion as noted at her recent office visits.  She  does maintain functional range of motion with relatively good strength but does have significant pain.  She is neurovascular intact in the right upper extremity and examination otherwise as noted at her recent office visits.  Imaging studies confirm changes consistent with chronic right shoulder rotator cuff tear arthropathy.  Vitals  Temp:  [98.1 F (36.7 C)] 98.1 F (36.7 C) (12/18 0532) Pulse Rate:  [88] 88 (12/18 0532) Resp:  [17] 17 (12/18 0532) BP: (152)/(77) 152/77 (12/18 0532) SpO2:  [97 %] 97 % (12/18 0532)  Assessment/Plan  Impression: Right shoulder osteoarthritis  Plan of Action:  Procedures: ARTHROPLASTY, SHOULDER, TOTAL, REVERSE  Rachel Barker 09/08/2024, 5:48 AM Contact # [phone]

## 2024-09-09 ENCOUNTER — Encounter (HOSPITAL_COMMUNITY): Payer: Self-pay | Admitting: Orthopedic Surgery
